# Patient Record
Sex: Female | Born: 1965
Health system: Southern US, Community
[De-identification: ages and names within clinical notes are randomized; demographics above are authoritative.]

## PROBLEM LIST (undated history)

## (undated) DIAGNOSIS — E785 Hyperlipidemia, unspecified: Secondary | ICD-10-CM

## (undated) DIAGNOSIS — R011 Cardiac murmur, unspecified: Secondary | ICD-10-CM

## (undated) DIAGNOSIS — F32A Depression, unspecified: Secondary | ICD-10-CM

## (undated) DIAGNOSIS — F419 Anxiety disorder, unspecified: Secondary | ICD-10-CM

## (undated) DIAGNOSIS — N200 Calculus of kidney: Secondary | ICD-10-CM

## (undated) DIAGNOSIS — M1990 Unspecified osteoarthritis, unspecified site: Secondary | ICD-10-CM

## (undated) DIAGNOSIS — K219 Gastro-esophageal reflux disease without esophagitis: Secondary | ICD-10-CM

## (undated) HISTORY — DX: Gastro-esophageal reflux disease without esophagitis: K21.9

## (undated) HISTORY — DX: Depression, unspecified: F32.A

## (undated) HISTORY — PX: COSMETIC SURGERY: SHX468

## (undated) HISTORY — PX: ABLATION: SHX5711

## (undated) HISTORY — PX: TUBAL LIGATION: SHX77

## (undated) HISTORY — DX: Unspecified osteoarthritis, unspecified site: M19.90

## (undated) HISTORY — DX: Cardiac murmur, unspecified: R01.1

## (undated) HISTORY — DX: Anxiety disorder, unspecified: F41.9

---

## 2000-09-30 ENCOUNTER — Encounter: Admission: RE | Admit: 2000-09-30 | Discharge: 2000-12-29 | Payer: Self-pay | Admitting: Obstetrics and Gynecology

## 2001-01-11 ENCOUNTER — Encounter: Payer: Self-pay | Admitting: Obstetrics and Gynecology

## 2001-01-11 ENCOUNTER — Inpatient Hospital Stay (HOSPITAL_COMMUNITY): Admission: AD | Admit: 2001-01-11 | Discharge: 2001-01-11 | Payer: Self-pay | Admitting: Obstetrics and Gynecology

## 2001-03-01 ENCOUNTER — Inpatient Hospital Stay (HOSPITAL_COMMUNITY): Admission: AD | Admit: 2001-03-01 | Discharge: 2001-03-03 | Payer: Self-pay | Admitting: Obstetrics and Gynecology

## 2001-05-29 ENCOUNTER — Other Ambulatory Visit: Admission: RE | Admit: 2001-05-29 | Discharge: 2001-05-29 | Payer: Self-pay | Admitting: Obstetrics and Gynecology

## 2002-10-11 ENCOUNTER — Other Ambulatory Visit: Admission: RE | Admit: 2002-10-11 | Discharge: 2002-10-11 | Payer: Self-pay | Admitting: Obstetrics and Gynecology

## 2003-07-16 ENCOUNTER — Encounter: Admission: RE | Admit: 2003-07-16 | Discharge: 2003-07-16 | Payer: Self-pay | Admitting: Obstetrics and Gynecology

## 2003-08-01 ENCOUNTER — Inpatient Hospital Stay (HOSPITAL_COMMUNITY): Admission: AD | Admit: 2003-08-01 | Discharge: 2003-08-01 | Payer: Self-pay | Admitting: Obstetrics and Gynecology

## 2003-08-06 ENCOUNTER — Inpatient Hospital Stay (HOSPITAL_COMMUNITY): Admission: AD | Admit: 2003-08-06 | Discharge: 2003-08-06 | Payer: Self-pay | Admitting: Obstetrics and Gynecology

## 2003-08-30 ENCOUNTER — Encounter (INDEPENDENT_AMBULATORY_CARE_PROVIDER_SITE_OTHER): Payer: Self-pay | Admitting: Specialist

## 2003-08-30 ENCOUNTER — Inpatient Hospital Stay (HOSPITAL_COMMUNITY): Admission: RE | Admit: 2003-08-30 | Discharge: 2003-09-02 | Payer: Self-pay | Admitting: Obstetrics and Gynecology

## 2003-10-14 ENCOUNTER — Other Ambulatory Visit: Admission: RE | Admit: 2003-10-14 | Discharge: 2003-10-14 | Payer: Self-pay | Admitting: Obstetrics and Gynecology

## 2004-05-14 ENCOUNTER — Encounter: Admission: RE | Admit: 2004-05-14 | Discharge: 2004-05-14 | Payer: Self-pay | Admitting: Family Medicine

## 2004-12-16 ENCOUNTER — Other Ambulatory Visit: Admission: RE | Admit: 2004-12-16 | Discharge: 2004-12-16 | Payer: Self-pay | Admitting: Obstetrics and Gynecology

## 2008-02-06 ENCOUNTER — Encounter: Admission: RE | Admit: 2008-02-06 | Discharge: 2008-02-06 | Payer: Self-pay | Admitting: Obstetrics and Gynecology

## 2008-09-01 ENCOUNTER — Emergency Department (HOSPITAL_COMMUNITY): Admission: EM | Admit: 2008-09-01 | Discharge: 2008-09-01 | Payer: Self-pay | Admitting: Emergency Medicine

## 2008-09-01 ENCOUNTER — Ambulatory Visit: Payer: Self-pay | Admitting: Internal Medicine

## 2009-04-21 ENCOUNTER — Encounter: Admission: RE | Admit: 2009-04-21 | Discharge: 2009-04-21 | Payer: Self-pay | Admitting: Family Medicine

## 2010-01-18 IMAGING — CT CT HEAD W/O CM
1 series · 16 of 30 positions shown, 20 images · non-contrast
Comparison: Head CT scan 05/14/2004.

CLINICAL DATA: Left sided facial numbness radiating to the left
arm.

CT HEAD WITHOUT CONTRAST
TECHNIQUE: Contiguous axial images were obtained from the base of
the skull through the vertex without contrast.

[Series 2: (id) head 4.8 h37s st · axial · 0.47mm/px · z∈[+1309,+1446]mm · 16 of 30 slices shown, 20 images]
[im 2/30  brain]
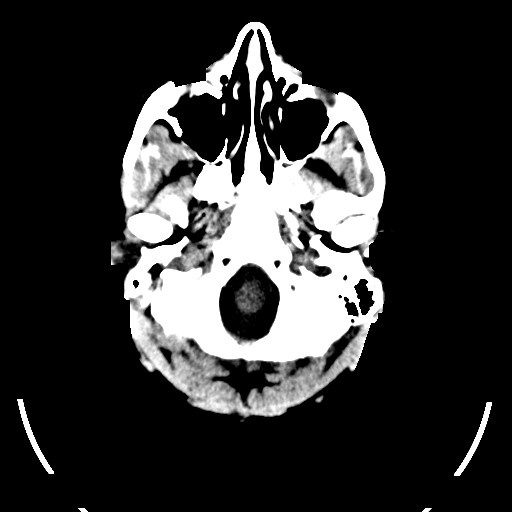
[im 2/30  bone]
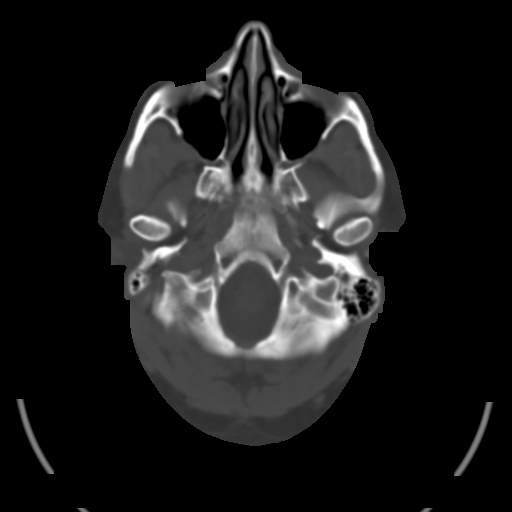
[im 4/30  brain]
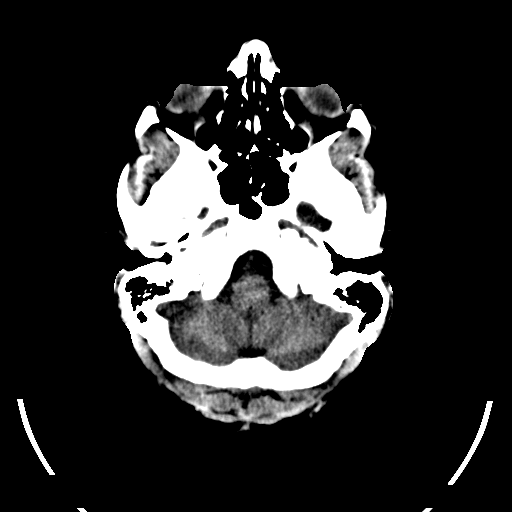
[im 6/30  brain]
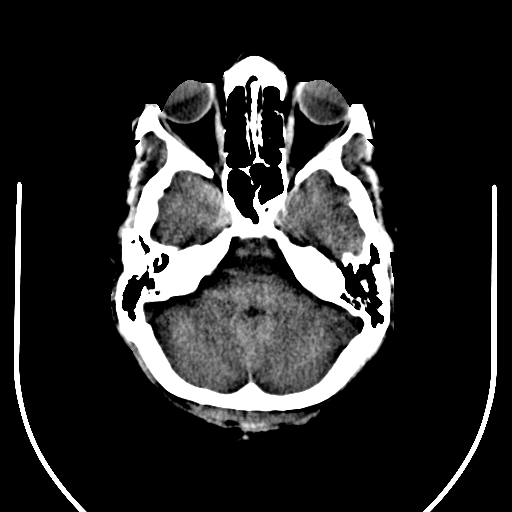
[im 8/30  brain]
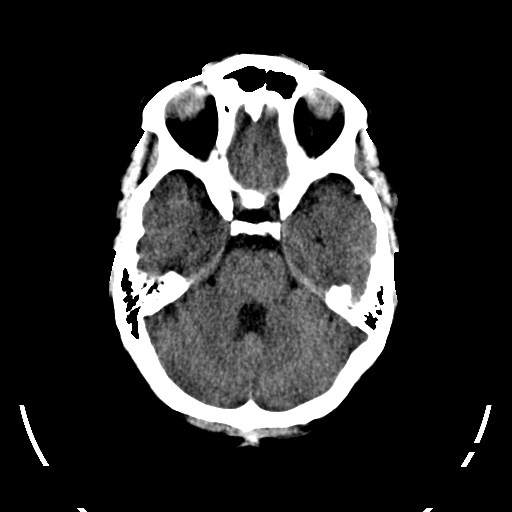
[im 9/30  brain]
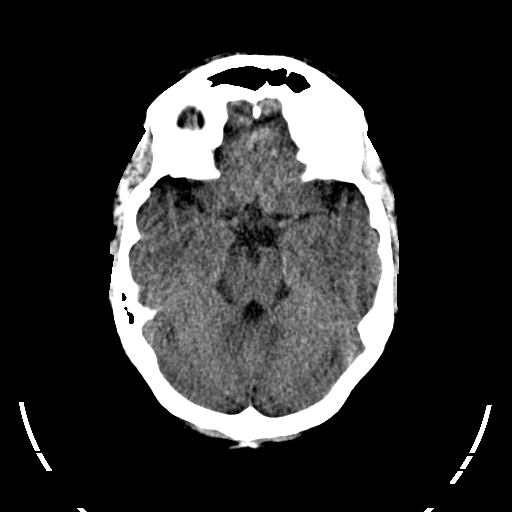
[im 9/30  bone]
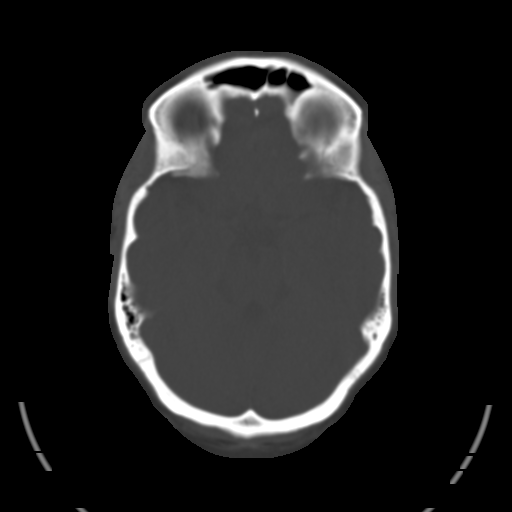
[im 11/30  brain]
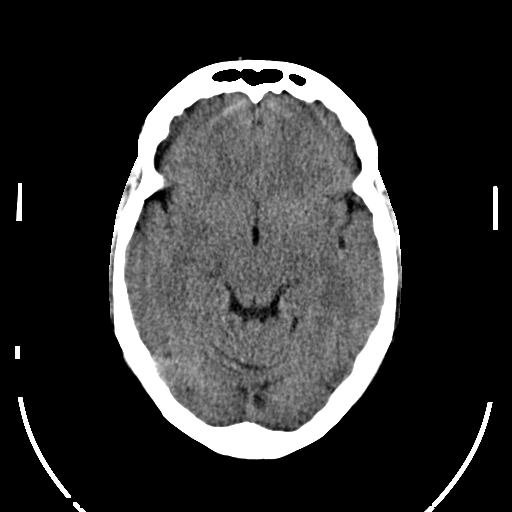
[im 13/30  brain]
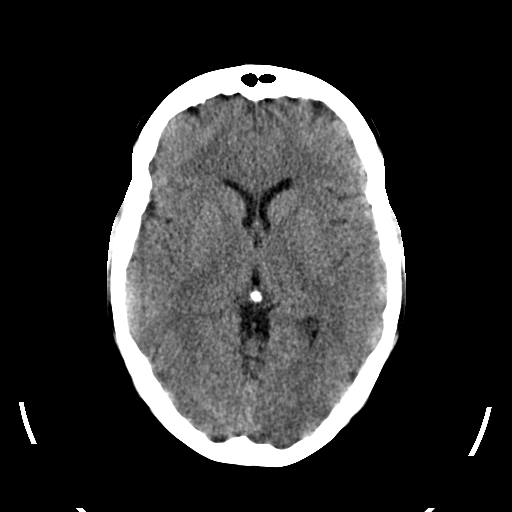
[im 15/30  brain]
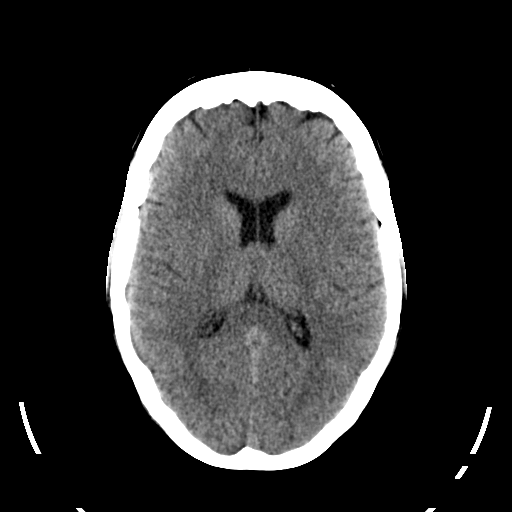
[im 16/30  brain]
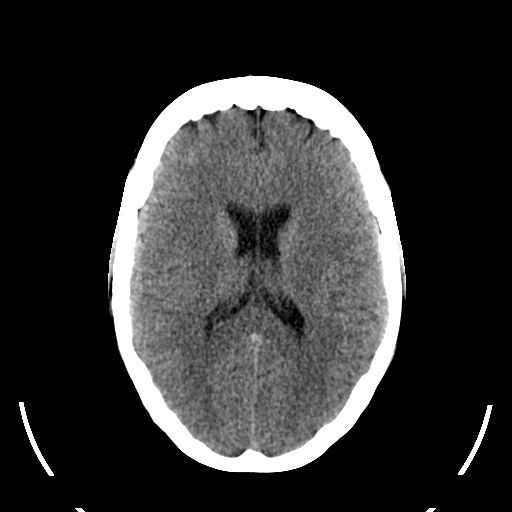
[im 16/30  bone]
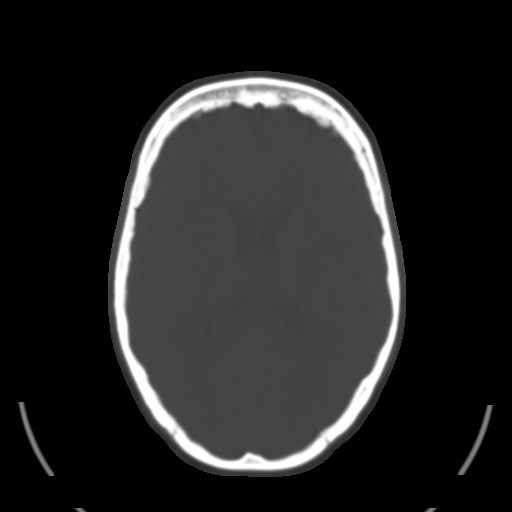
[im 18/30  brain]
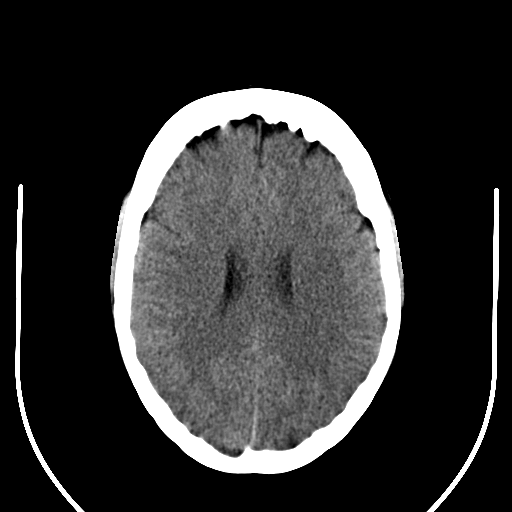
[im 20/30  brain]
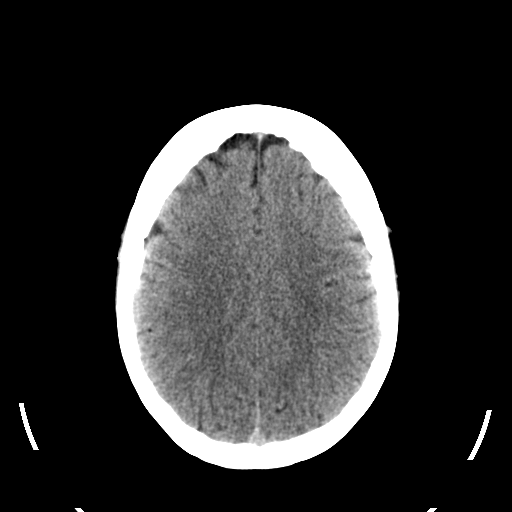
[im 22/30  brain]
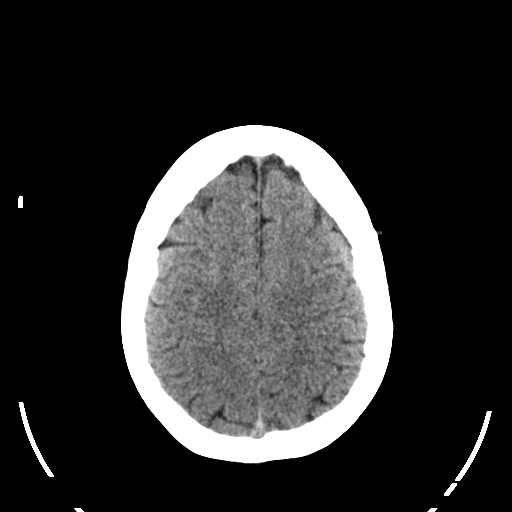
[im 23/30  brain]
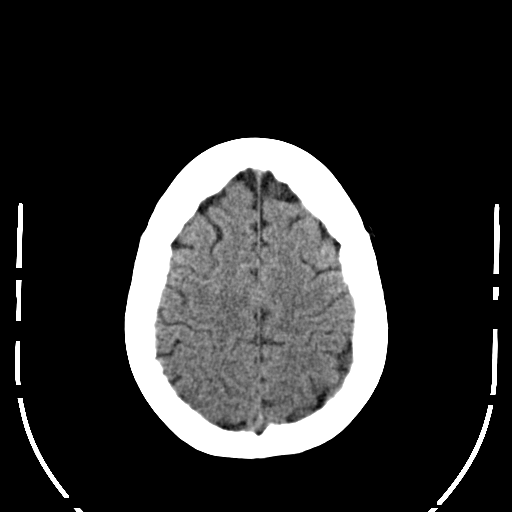
[im 23/30  bone]
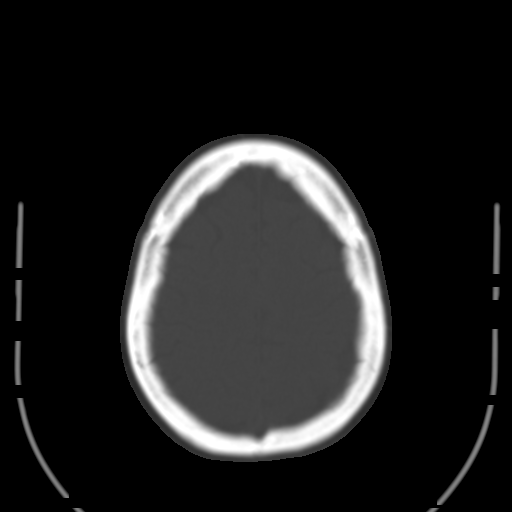
[im 25/30  brain]
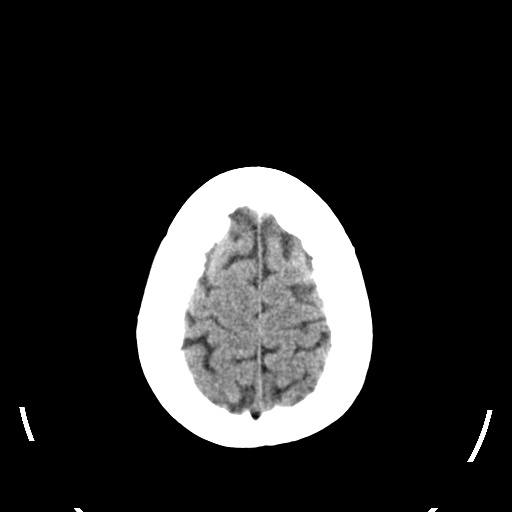
[im 27/30  brain]
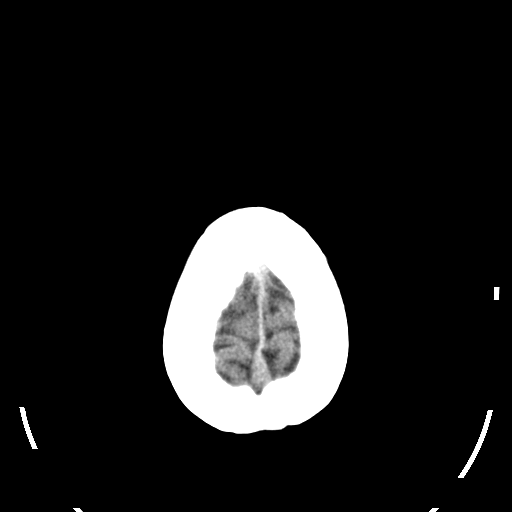
[im 29/30  brain]
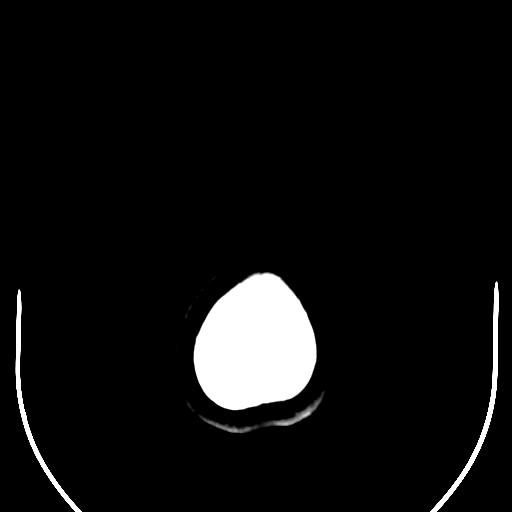

[16 of 30 positions shown; findings below may reference images not displayed]

FINDINGS: The brain appears normal without evidence of hemorrhage,
infarct, mass, mass effect, midline shift or abnormal extra-axial
fluid collection.  No hydrocephalus.  Imaged paranasal sinuses and
mastoid air cells are clear.
IMPRESSION: Negative exam.

REF:[id number] DICTATED: 09/01/2008 [DATE]

## 2010-06-30 ENCOUNTER — Encounter
Admission: RE | Admit: 2010-06-30 | Discharge: 2010-06-30 | Payer: Self-pay | Source: Home / Self Care | Attending: Family Medicine | Admitting: Family Medicine

## 2010-08-21 ENCOUNTER — Other Ambulatory Visit: Payer: Self-pay | Admitting: Family Medicine

## 2010-08-21 DIAGNOSIS — R928 Other abnormal and inconclusive findings on diagnostic imaging of breast: Secondary | ICD-10-CM

## 2010-08-25 ENCOUNTER — Ambulatory Visit
Admission: RE | Admit: 2010-08-25 | Discharge: 2010-08-25 | Disposition: A | Discharge status: Home / Self Care | Payer: Commercial Indemnity | Source: Ambulatory Visit | Attending: Family Medicine | Admitting: Family Medicine

## 2010-08-25 DIAGNOSIS — R928 Other abnormal and inconclusive findings on diagnostic imaging of breast: Secondary | ICD-10-CM

## 2010-11-03 LAB — CBC
HCT: 39.6 % (ref 36.0–46.0)
Hemoglobin: 13.6 g/dL (ref 12.0–15.0)
RBC: 4.23 MIL/uL (ref 3.87–5.11)

## 2010-11-03 LAB — CK TOTAL AND CKMB (NOT AT ARMC)
CK, MB: 1.4 ng/mL (ref 0.3–4.0)
CK, MB: 1.4 ng/mL (ref 0.3–4.0)
Relative Index: 1.4 (ref 0.0–2.5)

## 2010-11-03 LAB — BASIC METABOLIC PANEL
CO2: 25 mEq/L (ref 19–32)
Calcium: 8.9 mg/dL (ref 8.4–10.5)
GFR calc Af Amer: >60 mL/min (ref >60)
GFR calc non Af Amer: >60 mL/min (ref >60)
Potassium: 3.7 mEq/L (ref 3.5–5.1)
Sodium: 139 mEq/L (ref 135–145)

## 2010-11-03 LAB — TROPONIN I: Troponin I: <0.01 ng/mL (ref 0.00–0.06)

## 2010-11-03 LAB — POCT CARDIAC MARKERS
CKMB, poc: <1 ng/mL — ABNORMAL LOW (ref 1.0–8.0)
Myoglobin, poc: 40.4 ng/mL (ref 12–200)

## 2010-11-03 LAB — GLUCOSE, CAPILLARY: Glucose-Capillary: 110 mg/dL — ABNORMAL HIGH (ref 70–99)

## 2010-11-03 LAB — DIFFERENTIAL
Basophils Absolute: 0 10*3/uL (ref 0.0–0.1)
Lymphocytes Relative: 17 % (ref 12–46)
Neutro Abs: 5.6 10*3/uL (ref 1.7–7.7)
Neutrophils Relative %: 79 % — ABNORMAL HIGH (ref 43–77)

## 2010-12-01 NOTE — Consult Note (Signed)
NAMEJAMISON, YUHASZ                  ACCOUNT NO.:  [id number]   MEDICAL RECORD NO.:  [id number]          PATIENT TYPE:  INP   LOCATION:  1825                         FACILITY:  [facility]   PHYSICIAN:  Wendi Snipes, MD DATE OF BIRTH:  06-May-1966   DATE OF CONSULTATION:  09/01/2008  DATE OF DISCHARGE:                                 CONSULTATION   PRIMARY CARE DOCTOR:  Dr. Marny Lowenstein.   CHIEF COMPLAINT:  Shortness of breath, left face and arm tingling.   HISTORY OF PRESENT ILLNESS:  This is a 45 year old with hypertension,  diabetes, hyperlipidemia and history of panic attacks here with  complaints of shortness of breath, chest tightness, and left face an arm  paresthesias.  She states that she was in [employer] shopping when she  experienced the acute onset of these symptoms.  She was driving home  with her mother and her panicky feeling continued.  She denies outright  chest pain, though feels like she was mainly short of breath.  She  subsequently reported to an Urgent Care Center and after a brief workup  and a review of her current cardiac risk factors, she was referred to  the [facility] ED for further evaluation and management of her acute  symptoms.  With further questioning, she states that her symptoms feel  exactly the same like her previous panic attack.  Her last panic attack  happened within the last 5 years.  Otherwise she denies other cardiac  symptoms at baseline including dyspnea on exertion, shortness of breath,  chest pain, paroxysmal nocturnal dyspnea, orthopnea, increased lower  extremity edema, syncope, presyncope or palpitations.   PAST MEDICAL HISTORY:  1. Diabetes.  2. Hypertension.  3. History of panic attacks.  4. Depression.  5. Hyperlipidemia.   ALLERGIES:  No known drug allergies.   MEDICATIONS ON ADMISSION:  1. Metformin.  2. Lipitor 80 mg daily.  3. Aspirin 81 mg daily.  4. Glipizide.  5. Atenolol.  6. Wellbutrin.  7. Amitriptyline.   SOCIAL HISTORY:  She lives in [place].  She works at [employer].  She has a 15 pack-year history.  However, quit approximately 3  years ago.   FAMILY HISTORY:  Her uncle died of an MI at 89.  Otherwise no other  premature coronary artery disease.   REVIEW OF SYSTEMS:  All 14 systems were reviewed and were negative  except as mentioned in detail in the HPI.   CODE STATUS:  Full.   PHYSICAL EXAM:  VITAL SIGNS:  Her blood pressure is 100/57, her pulse is  71 and regular.  She is breathing 16 times a minute, unlabored.  She is  satting 91% on 2 liters nasal cannula.  She has on dark fingernail  polish and is not to tracking well.  GENERAL:  She is a 45 year old white female appearing stated age in no  acute distress.  She is somewhat obese.  HEENT:  Moist mucous membranes.  Pupils equal, round, react to light  accommodation.  Anicteric sclera.  NECK:  No jugular venous distention.  No  thyromegaly.  CARDIOVASCULAR:  Regular rate and rhythm.  No murmurs, rubs or gallops.  LUNGS:  Clear to auscultation bilaterally.  ABDOMEN:  Nontender, nondistended.  Positive bowel sounds.  No masses.  EXTREMITIES:  No clubbing, cyanosis, edema, 2+ pulses throughout.  NEUROLOGIC:  Alert and oriented x3.  Cranial nerves II-XII grossly  intact.  No focal neurologic deficits.  SKIN:  Warm, dry intact.  No rashes.  PSYCH:  Mood and affect are appropriate.   RADIOLOGY:  Noncontrast CT of her head was negative for any acute  process.  Chest x-ray showed no cardiac or pulmonary process.   EKG:  Showed normal sinus rhythm with a rate of 67 with axis of -22.  However, no ST or T-wave abnormalities suggestive of ischemia and normal  EKG.   LABORATORY REVIEW:  Her white count 7.1, hematocrit is 39.6, platelets  226,000.  Potassium is 3.7.  Her BUN 8, her creatinine is 0.7.  Her  troponin and CK-MB are undetectable at this point.   ASSESSMENT:  This is a 45 year old white female with cardiac  risk  factors here with atypical chest pain and shortness of breath.  Chest  pain.  This is very atypical for acute coronary artery syndrome and  there are currently no objective signs of ischemia.  Her symptoms are  currently resolved and she has no concerning symptoms at baseline.  Please continue risk factor modification as you are and I have advised  the patient to closely follow up with her PCP concerning possible  exacerbation of her anxiety and depression.  Please consider further  noninvasive risk ratification if her symptoms continue and are more  associated with exertion.      Wendi Snipes, MD  Electronically Signed     BHH/MEDQ  D:  09/01/2008  T:  09/01/2008  Job:  [id number]

## 2010-12-01 NOTE — H&P (Signed)
[name], [name]                  ACCOUNT NO.:  [id number]   MEDICAL RECORD NO.:  [id number]          PATIENT TYPE:  INP   LOCATION:  1825                         FACILITY:  [facility]   PHYSICIAN:  Kela Millin, M.D.DATE OF BIRTH:  09-Jul-1966   DATE OF ADMISSION:  09/01/2008  DATE OF DISCHARGE:                              HISTORY & PHYSICAL   PRIMARY CARE PHYSICIAN:  Dr. Dorothe Pea.   CHIEF COMPLAINT:  Chest pain and left arm numbness.   HISTORY OF PRESENT ILLNESS:  The patient is an obese 45 year old white  female with past medical history significant for diabetes and  hypertension who presents with the above complaints.  She states that  she was out shopping today when she developed chest pain, left  precordial, and she states that she would not describe it as pressure  but unable to further characterize and states that it lasted just about  a second and resolved spontaneously.  She had some associated shortness  of breath and numbness with tingling down her left arm and initially  also had some numbness on the left side of her face but that resolved.  She reports that she also has a history of panic attacks and so she  began to panic after the onset of her symptoms.  She initially went to  the [facility] and was asked to come to the ED.  She denies  cough, fevers, hematemesis, melena, hematochezia, and no focal weakness.   She was seen in the ED and an EKG showed normal sinus rhythm at a rate  of 67 with T-wave inversions in III and aVF.  Her point of care markers  were negative.  She also had a CT scan of her head done which was  negative.  She is admitted for further evaluation and management.   PAST MEDICAL HISTORY:  As above.   MEDICATIONS:  1. Metformin 1000 mg p.o. b.i.d.  2. (?)Glimepiride 1 mg daily.  3. Atenolol - does not know dose.  4. Wellbutrin 300 mg daily.  5. Amitriptyline 25 mg nightly.   ALLERGIES:  NKDA.   SOCIAL HISTORY:  She quit tobacco 3  years ago after smoking for about 15-  20 years.  She denies alcohol.   FAMILY HISTORY:  Negative for heart disease.   REVIEW OF SYSTEMS:  As per HPI, other review of systems negative.   PHYSICAL EXAM:  GENERAL:  The patient is a pleasant obese middle-aged  white female, appears anxious, and it in no respiratory distress.  VITAL SIGNS:  Her temperature is 97.8, blood pressure is 104/48.  Pulse  is 64, respiratory rate is 16, O2 sat is 99%.  HEENT:  PERRL, EOMI, sclerae anicteric, moist mucous membranes.  NECK:  Supple, no adenopathy, no thyromegaly and no JVD.  LUNGS:  Clear to auscultation bilaterally.  No crackles or wheezes.  CARDIOVASCULAR:  Regular rate and rhythm.  Normal S1, S2.  No S3  appreciated.  ABDOMEN:  Obese, bowel sounds present, nontender, nondistended.  No  organomegaly and no masses palpable.  EXTREMITIES:  No cyanosis and  no edema.  NEURO:  She is alert and oriented x3.  Cranial nerves 2-12 are grossly  intact.  Nonfocal exam.   LABORATORY DATA:  As per HPI.  Also her white cell count is 7.1 with a  hemoglobin of 13.6, hematocrit of 39.6, platelet count of 226,000,  neutrophil count of 79%.  Sodium is 139, potassium 3.7, chloride 107,  CO2 of 25, glucose is 108, BUN is 8, creatinine is 0.67, calcium of 8.9.  Chest x-ray - no acute infiltrates.   ASSESSMENT AND PLAN:  1. Chest pain - as discussed above, also T-wave inversions in III and      aVF and she has a history of hypertension, diabetes and is obese.      Will obtain serial cardiac enzymes and a D-dimer.  Place on aspirin      and p.r.n. nitroglycerin.  I have consulted cardiology for further      risk stratification.  Likely anxiety component present, will add      Xanax and continue outpatient medications.  2. Diabetes - Accu-Cheks, sliding scale insulin coverage, continue      Amaryl.  Will hold metformin for now in anticipation of further      cardiac evaluation.  3. Hypertension - will continue  atenolol, follow and obtain outpatient      dose.  4. History of depression/anxiety - as above Xanax and continue      outpatient medications.      Kela Millin, M.D.  Electronically Signed     ACV/MEDQ  D:  09/01/2008  T:  09/01/2008  Job:  [id number]   cc:   Jethro Bastos, M.D.

## 2010-12-04 NOTE — Op Note (Signed)
[facility]  Patient:    Laura [name], Laura [name] Visit Number: [id number] MRN: [id number]          Service Type: OBS Location: 910A 9146 01 Attending Physician:  Marcelle Overlie Dictated by:   Marcelle Overlie, M.D. Proc. Date: 03/01/01 Admit Date:  03/01/2001 Discharge Date: 03/03/2001                             Operative Report  PREOPERATIVE DIAGNOSES:       1. Intrauterine pregnancy at 35-6/7 weeks.                               2. Preterm premature rupture of membranes.                               3. Breech.                               4. Insulin-requiring gestational diabetic.  POSTOPERATIVE DIAGNOSES:            1. Intrauterine pregnancy at 35-6/7 weeks.                               2. Preterm premature rupture of membranes.                               3. Breech.                               4. Insulin-requiring gestational diabetic.  PROCEDURE:                    Primary low transverse cesarean section.  SURGEON:                      Marcelle Overlie, M.D.  ANESTHESIA:                   Spinal.  ESTIMATED BLOOD LOSS:         500 cc.  FINDINGS:                     Female infant in frank breech presentation with Apgars of 8 at one minute and 9 at five minutes weighing 6 lb 12 oz.  DESCRIPTION OF PROCEDURE:     The patient was taken to the operating room after informed consent was obtained.  She then had a spinal placed without incident.  The abdomen was prepped and draped in the usual sterile fashion.  A Foley catheter was placed in the bladder.  Using a scalpel, a low transverse incision was made and carried down to the fascia.  Using good hemostasis, the fascia was scored in the midline and extended laterally.  A Pfannenstiel incision incision was then created.  The peritoneum was entered sharply.  The peritoneal incision was then extended.  A bladder flap was then created sharply and digitally and a bladder blade was then inserted.   The lower uterine segment was then identified.  A low transverse incision was made in the uterus and extended laterally.  The amniotic  fluid was noted to be clear upon entry into the uterine cavity.  The baby was in the breech presentation and was delivered without difficulty.  It was a female infant with Apgars of 8 at one minute and 9 at five minutes and weighed 6 lb 12 oz.  The cord was clamped and cut.  The baby was handed to the waiting pediatricians.  The placenta was manually removed and noted to be intact.  The uterus was cleared of all clot and debris.  The uterine incision was closed in a single layer using 0 chromic in continuous running lock stitch.  It was inspected and noted to be hemostatic.  The peritoneum was closed in a single layer using 0 Vicryl in a continuous running stitch.  The rectus muscles were reapproximated using the same 0 Vicryl.  The fascia was closed using 0 Vicryl in a continuous running stitch starting at each corner and meeting in the midline.  After inspection of the subcutaneous layer, the skin was closed with staples.  All sponge, lap and instrument counts were correct x 2.  The patient tolerated the procedure well and went to the recovery room in stable condition. Dictated by:   Marcelle Overlie, M.D. Attending Physician:  Marcelle Overlie DD:  03/17/01 TD:  03/17/01 Job: [id number]

## 2010-12-04 NOTE — Discharge Summary (Signed)
Laura [name], Laura [name]                            ACCOUNT NO.:  [id number]   MEDICAL RECORD NO.:  [id number]                   PATIENT TYPE:  INP   LOCATION:  9130                                 FACILITY:  [facility]   PHYSICIAN:  Duke Salvia. Marcelle Overlie, M.D.            DATE OF BIRTH:  07/21/1965   DATE OF ADMISSION:  08/30/2003  DATE OF DISCHARGE:  09/02/2003                                 DISCHARGE SUMMARY   ADMITTING DIAGNOSES:  1. Intrauterine pregnancy at 37 weeks estimated gestational age.  2. Previous cesarean section; desires repeat.  3. Gestational diabetes.  4. Mature amniocentesis.  5. Multiparity, desires sterilization.   DISCHARGE DIAGNOSES:  1. Low transverse cesarean section.  2. Viable female infant.  3. Permanent sterilization.   PROCEDURE:  1. Repeat low transverse cesarean section.  2. Modified Pomeroy bilateral tubal ligation.   REASON FOR ADMISSION:  Please see written H&P.   HOSPITAL COURSE:  The patient was [name] 45 year old, gravida 2, para 1, who was  admitted to [facility] for [name] scheduled cesarean section.  The patient had had gestational diabetes during her pregnancy, which had  required insulin administration for control of her glucose level.  Amniocentesis had been performed prior to admission on the previous day,  which revealed an LS ratio of 4.7:1 with PG.  The patient had also requested  bilateral tubal ligation.  The patient was admitted on the following morning  for the scheduled cesarean section.  She was taken to the operating room  where spinal anesthesia was administered without difficulty.  [name] low  transverse incision was made with delivery of [name] viable female infant weighing  6 pounds, 7 ounces, with Apgars of 8 at 1 minute and 9 at 5 minutes.  The  patient tolerated the procedure well, and was taken to the recovery room in  stable condition.   On postoperative day #1, the patient was without complaints, vital signs  were stable, she was  afebrile, abdomen was soft, with good return of bowel  function.  Abdominal dressing was noted to be clean, dry, and intact.  Laboratories revealed [name] hemoglobin of 12.4.  WBC count of 11.5.  On  postoperative day #2, the patient was without complaint, she was tolerating  [name] regular diet, vital signs were stable.  The abdominal dressing had been  removed, which revealed an incision that was clean, dry, and intact.  The  patient was ambulating well.  On postoperative day #3, the patient was doing  well, vital signs were stable, she was afebrile, and the incision was clean,  dry, and intact.  The staples were removed, and the patient was discharged  home.   CONDITION ON DISCHARGE:  Good.   DIET:  Regular as tolerated.   ACTIVITY:  No heavy lifting.  No driving x2 weeks.  No vaginal entry.   FOLLOW UP:  The patient is to follow up  in the office in 1 week for an  incision check.  She is to call for temperature greater than 100.3,  persistent nausea or vomiting, heavy vaginal bleeding, any redness or  drainage at the incision site.   DISCHARGE MEDICATIONS:  1. Percocet 5/532, #30, one p.o. q.4-6h. p.r.n. pain.  2. Motrin 600 mg q.8h. p.r.n.  3. Prenatal vitamins one p.o. daily.  4. Colace one p.o. daily p.r.n.     Julio Sicks, N.P.                        Richard M. Marcelle Overlie, M.D.    CC/MEDQ  D:  09/22/2003  T:  09/22/2003  Job:  [id number]

## 2010-12-04 NOTE — Discharge Summary (Signed)
[facility]  Patient:    Laura [name], Laura [name] Visit Number: [id number] MRN: [id number]          Service Type: OBS Location: 910A 9146 01 Attending Physician:  Marcelle Overlie Dictated by:   Miguel Aschoff, M.D. Admit Date:  03/01/2001 Discharge Date: 03/03/2001                             Discharge Summary  ADMISSION DIAGNOSES:          1. Intrauterine pregnancy at 35-6/7 weeks.                               2. Spontaneous rupture of membranes.                               3. Breech presentation.                               4. Insulin-requiring gestational diabetes.  OPERATIONS AND PROCEDURES:    1. Primary low flap transverse cesarean section.                               2. Spinal anesthesia.  BRIEF HISTORY:                The patient is a 45 year old white female, gravida 1, para 0, who developed gestational diabetes requiring insulin therapy. The patient presented to the [facility] on March 01, 2001 with report of spontaneous rupture of membranes. On examination, she was found to have ruptured membranes, but, in addition, was noted to be presenting with a breech presentation. In view of the malpresentation, the patient was taken to the operating room where under spinal anesthesia, a primary low flap transverse cesarean section was carried out by Dr. Vincente Poli and this resulted in the delivery of a viable female infant, Apgars 9 at one minute and 9 at five minutes, as a frank breech. The baby weighed 6 pounds 12 ounces. The patients postoperative course was essentially uncomplicated. She tolerated increasing in ambulation and diet well. She did not require any further insulin. She was able to be discharged home on August 16 in satisfactory condition.  DISCHARGE FOLLOWUP:           She was told to return to the office on August 19 for her staples to be removed.  DISCHARGE MEDICATIONS:        Tylox one every three hours as needed  for pain.  DISCHARGE INSTRUCTIONS:       She was instructed to do no heavy lifting, place nothing in the vagina, and to call if there were any problems such as fever, pain or heavy bleeding. Dictated by:   Miguel Aschoff, M.D. Attending Physician:  Marcelle Overlie DD:  04/13/01 TD:  04/13/01 Job: [id number] XL/[id number]

## 2010-12-04 NOTE — Op Note (Signed)
NAME:  Laura Laura [name], Laura Laura [name]                            ACCOUNT NO.:  [id number]   MEDICAL RECORD NO.:  [id number]                   PATIENT TYPE:  INP   LOCATION:  9130                                 FACILITY:  [facility]   PHYSICIAN:  Michelle L. Vincente Poli, M.D.            DATE OF BIRTH:  [birth year]   DATE OF PROCEDURE:  08/30/2003  DATE OF DISCHARGE:                                 OPERATIVE REPORT   PREOPERATIVE DIAGNOSES:  1. Intrauterine pregnancy at 37 weeks.  2. Previous cesarean section.  3. Gestational diabetes.  4. Mature amniocentesis.  5. Desires permanent sterilization.   POSTOPERATIVE DIAGNOSES:  1. Intrauterine pregnancy at 37 weeks.  2. Previous cesarean section.  3. Gestational diabetes.  4. Mature amniocentesis.  5. Desires permanent sterilization.   PROCEDURES:  1. Repeat low transverse cesarean section.  2. Modified Pomeroy bilateral tubal ligation.   SURGEON:  Michelle L. Vincente Poli, M.D.   ANESTHESIA:  Spinal.   ESTIMATED BLOOD LOSS:  500 mL.   FINDINGS:  Laura [name] female infant, Apgars 8 at one minute and 9 at five minutes,  weighing 6 pounds 7 ounces.   DESCRIPTION OF PROCEDURE:  The patient was taken to the operating room,  spinal was placed without incident.  She was then prepped and draped in the  usual sterile fashion, Laura [name] Foley catheter was inserted in the bladder.  Using  Laura [name] scalpel, low transverse incision was made and carried down to the fascia  and the fascia was scored in the midline and extended laterally.  Laura [name]  Pfannenstiel incision was then developed.  The rectus muscles were separated  in the midline and the peritoneum was entered bluntly.  The peritoneal  incision was then stretched.  The bladder blade was inserted and the lower  uterine segment was identified.  The bladder flap was then developed sharply  and then digitally and the bladder blade was reinserted.  Laura [name] low transverse  incision was made in the uterus.  The uterus was then entered with Laura [name]  hemostat and  the incision was extended laterally.  The baby was in the  cephalic presentation and was delivered easily with one pull of the vacuum.  It was Laura [name] female infant with Apgars 8 at one minute and 9 at five minutes and  weighing 6 pounds 7 ounces.  The baby was handed to the awaiting  pediatricians after the cord was clamped and cut.  The cord blood was  obtained, and the placenta was manually removed and noted to be normal and  intact.  The uterus was exteriorized.  It was cleared of all clots and  debris.  The uterine incision was closed in one layer using 0 chromic in Laura [name]  continuous running stitch.  We then proceeded with Laura [name] bilateral tubal  ligation in the following fashion:  The midportion of each tube was grasped  using Laura [name] __________ clamp.  Each knuckle was tied  off using plain gut suture  x2 and the knuckle was excised using Metzenbaum scissors and was hemostatic.  The uterus was returned to the abdomen.  Irrigation was performed.  All  incisions were inspected and hemostasis was noted.  The peritoneum was  closed using an 0 Vicryl in Laura [name] continuous running stitch and the rectus  muscles were reapproximated using the same 0 Vicryl.  The fascia was closed  using continuous running stitch starting at each corner and meeting in the  midline.  After irrigation of the subcutaneous layer the skin was closed  with staples.  All sponge, lap, and instrument counts were correct x2.  The  patient tolerated the procedure well and went to the recovery room in stable  condition.                                               Michelle L. Vincente Poli, M.D.    Florestine Avers  D:  08/30/2003  T:  08/30/2003  Job:  [id number]

## 2011-03-18 ENCOUNTER — Other Ambulatory Visit: Payer: Self-pay | Admitting: Family Medicine

## 2011-03-18 ENCOUNTER — Ambulatory Visit
Admission: RE | Admit: 2011-03-18 | Discharge: 2011-03-18 | Disposition: A | Discharge status: Home / Self Care | Payer: Commercial Indemnity | Source: Ambulatory Visit | Attending: Family Medicine | Admitting: Family Medicine

## 2011-03-18 DIAGNOSIS — R0789 Other chest pain: Secondary | ICD-10-CM

## 2011-03-24 ENCOUNTER — Ambulatory Visit: Payer: Commercial Indemnity | Admitting: Family Medicine

## 2011-08-12 ENCOUNTER — Other Ambulatory Visit: Payer: Self-pay | Admitting: Family Medicine

## 2011-08-12 DIAGNOSIS — Z1231 Encounter for screening mammogram for malignant neoplasm of breast: Secondary | ICD-10-CM

## 2011-08-31 ENCOUNTER — Ambulatory Visit: Payer: Commercial Indemnity

## 2011-09-04 ENCOUNTER — Emergency Department (INDEPENDENT_AMBULATORY_CARE_PROVIDER_SITE_OTHER): Payer: Commercial Indemnity

## 2011-09-04 ENCOUNTER — Emergency Department (HOSPITAL_BASED_OUTPATIENT_CLINIC_OR_DEPARTMENT_OTHER)
Admission: EM | Admit: 2011-09-04 | Discharge: 2011-09-04 | Disposition: A | Discharge status: Home / Self Care | Payer: Commercial Indemnity | Attending: Emergency Medicine | Admitting: Emergency Medicine

## 2011-09-04 ENCOUNTER — Encounter (HOSPITAL_BASED_OUTPATIENT_CLINIC_OR_DEPARTMENT_OTHER): Payer: Self-pay | Admitting: *Deleted

## 2011-09-04 DIAGNOSIS — R109 Unspecified abdominal pain: Secondary | ICD-10-CM | POA: Insufficient documentation

## 2011-09-04 DIAGNOSIS — N390 Urinary tract infection, site not specified: Secondary | ICD-10-CM | POA: Insufficient documentation

## 2011-09-04 DIAGNOSIS — R1031 Right lower quadrant pain: Secondary | ICD-10-CM

## 2011-09-04 HISTORY — DX: Calculus of kidney: N20.0

## 2011-09-04 LAB — URINALYSIS, ROUTINE W REFLEX MICROSCOPIC
Glucose, UA: 500 mg/dL — AB
Leukocytes, UA: NEGATIVE
Protein, ur: NEGATIVE mg/dL
Specific Gravity, Urine: 1.028 (ref 1.005–1.030)
pH: 5.5 (ref 5.0–8.0)

## 2011-09-04 LAB — URINE MICROSCOPIC-ADD ON

## 2011-09-04 LAB — PREGNANCY, URINE: Preg Test, Ur: NEGATIVE

## 2011-09-04 MED ORDER — CIPROFLOXACIN HCL 500 MG PO TABS: 500.0000 mg | ORAL_TABLET | Freq: Two times a day (BID) | ORAL | Status: DC

## 2011-09-04 MED ORDER — DEXTROSE 5 % IV SOLN
1.0000 g | INTRAVENOUS | Status: DC
  Administered 2011-09-04: 1 g via INTRAVENOUS
  Filled 2011-09-04: qty 10

## 2011-09-04 MED ORDER — CIPROFLOXACIN HCL 500 MG PO TABS: 500.0000 mg | ORAL_TABLET | Freq: Two times a day (BID) | ORAL | Status: AC

## 2011-09-04 MED ORDER — HYDROCODONE-ACETAMINOPHEN 5-325 MG PO TABS: 2.0000 | ORAL_TABLET | ORAL | Status: AC | PRN

## 2011-09-04 MED ORDER — ONDANSETRON HCL 4 MG/2ML IJ SOLN
4.0000 mg | Freq: Once | INTRAMUSCULAR | Status: AC
  Administered 2011-09-04: 4 mg via INTRAVENOUS
  Filled 2011-09-04: qty 2

## 2011-09-04 MED ORDER — HYDROMORPHONE HCL PF 1 MG/ML IJ SOLN
1.0000 mg | Freq: Once | INTRAMUSCULAR | Status: AC
  Administered 2011-09-04: 1 mg via INTRAVENOUS
  Filled 2011-09-04: qty 1

## 2011-09-04 NOTE — ED Provider Notes (Signed)
History     CSN: [id number]  Arrival date & time 09/04/11  1350   First MD Initiated Contact with Patient 09/04/11 1518      Chief Complaint  Patient presents with  . Flank Pain    (Consider location/radiation/quality/duration/timing/severity/associated sxs/prior treatment) Patient is a 46 y.o. female presenting with abdominal pain. The history is provided by the patient. No language interpreter was used.  Abdominal Pain The primary symptoms of the illness include abdominal pain. The current episode started less than 1 hour ago. The onset of the illness was gradual. The problem has been gradually worsening.  Associated with: hx of kidney stones. The patient states that she believes she is currently not pregnant. The patient has not had a change in bowel habit. Additional symptoms associated with the illness include back pain. Significant associated medical issues include diabetes. Significant associated medical issues do not include inflammatory bowel disease.  Pt complains of pain in the right side of her back. Pt reports she has been evaluated in the past for kidney stone    Past Medical History  Diagnosis Date  . Kidney stones   . Diabetes mellitus     Past Surgical History  Procedure Date  . Cesarean section     History reviewed. No pertinent family history.  History  Substance Use Topics  . Smoking status: Never Smoker   . Smokeless tobacco: Not on file  . Alcohol Use: No    OB History    Grav Para Term Preterm Abortions TAB SAB Ect Mult Living                  Review of Systems  Gastrointestinal: Positive for abdominal pain.  Musculoskeletal: Positive for back pain.  All other systems reviewed and are negative.    Allergies  Review of patient's allergies indicates no known allergies.  Home Medications   Current Outpatient Rx  Name Route Sig Dispense Refill  . ATENOLOL 50 MG PO TABS Oral Take 50 mg by mouth daily.    . ATORVASTATIN CALCIUM 80 MG PO  TABS Oral Take 80 mg by mouth daily.    Marland Kitchen GLIMEPIRIDE 4 MG PO TABS Oral Take 4 mg by mouth daily before breakfast.    . LEVEMIR [place] Subcutaneous Inject into the skin.    Marland Kitchen METFORMIN HCL 1000 MG PO TABS Oral Take 1,000 mg by mouth 2 (two) times daily with a meal.      BP 130/71  Pulse 62  Temp(Src) 97.6 F (36.4 C) (Axillary)  Resp 20  Ht 5' 4.5" (1.638 m)  Wt 220 lb (99.791 kg)  BMI 37.18 kg/m2  SpO2 98%  LMP 08/24/2011  Physical Exam  Constitutional: She is oriented to person, place, and time. She appears well-developed and well-nourished.  HENT:  Head: Normocephalic and atraumatic.  Eyes: Pupils are equal, round, and reactive to light.  Neck: Normal range of motion.  Cardiovascular: Normal rate.   Pulmonary/Chest: Effort normal.  Abdominal: Soft. Bowel sounds are normal. There is tenderness.  Musculoskeletal: Normal range of motion.  Neurological: She is alert and oriented to person, place, and time.  Skin: Skin is warm.  Psychiatric: She has a normal mood and affect.    ED Course  Procedures (including critical care time)   Labs Reviewed  PREGNANCY, URINE  URINALYSIS, ROUTINE W REFLEX MICROSCOPIC   No results found.   No diagnosis found.    MDM  Pt given Iv fluids,  Pt's urine shows uti. No stone.  I advised follow up with MD on Monday       Langston Masker, [place] 09/04/11 1821

## 2011-09-04 NOTE — ED Notes (Signed)
Patient transported to CT 

## 2011-09-04 NOTE — ED Notes (Signed)
Pt is requesting pain medication to help with the pain, Clydie Braun, [place] notified.

## 2011-09-04 NOTE — ED Notes (Signed)
Pt has hx of kidney stones and is c/o right flank and RLQ pain since 3a. Difficulty urinating.

## 2011-09-04 NOTE — Discharge Instructions (Signed)

## 2011-09-04 NOTE — ED Notes (Signed)
rx x 2 for cipro and norco- pt has a ride

## 2011-09-05 NOTE — ED Provider Notes (Signed)
Medical screening examination/treatment/procedure(s) were performed by non-physician practitioner and as supervising physician I was immediately available for consultation/collaboration.  Courney Garrod, MD 09/05/11 0037 

## 2011-09-07 ENCOUNTER — Ambulatory Visit
Admission: RE | Admit: 2011-09-07 | Discharge: 2011-09-07 | Disposition: A | Discharge status: Home / Self Care | Payer: Commercial Indemnity | Source: Ambulatory Visit | Attending: Family Medicine | Admitting: Family Medicine

## 2011-09-07 DIAGNOSIS — Z1231 Encounter for screening mammogram for malignant neoplasm of breast: Secondary | ICD-10-CM

## 2011-11-17 ENCOUNTER — Other Ambulatory Visit: Payer: Self-pay | Admitting: Obstetrics and Gynecology

## 2012-05-26 ENCOUNTER — Emergency Department
Admission: EM | Admit: 2012-05-26 | Discharge: 2012-05-26 | Disposition: A | Discharge status: Home / Self Care | Payer: Commercial Indemnity | Source: Home / Self Care

## 2012-05-26 ENCOUNTER — Encounter: Payer: Self-pay | Admitting: *Deleted

## 2012-05-26 ENCOUNTER — Emergency Department (INDEPENDENT_AMBULATORY_CARE_PROVIDER_SITE_OTHER): Payer: 59

## 2012-05-26 DIAGNOSIS — E119 Type 2 diabetes mellitus without complications: Secondary | ICD-10-CM

## 2012-05-26 DIAGNOSIS — R05 Cough: Secondary | ICD-10-CM

## 2012-05-26 DIAGNOSIS — J069 Acute upper respiratory infection, unspecified: Secondary | ICD-10-CM

## 2012-05-26 DIAGNOSIS — J40 Bronchitis, not specified as acute or chronic: Secondary | ICD-10-CM

## 2012-05-26 HISTORY — DX: Hyperlipidemia, unspecified: E78.5

## 2012-05-26 MED ORDER — AZITHROMYCIN 250 MG PO TABS: ORAL_TABLET | ORAL | Status: DC

## 2012-05-26 MED ORDER — METHYLPREDNISOLONE SODIUM SUCC 125 MG IJ SOLR
125.0000 mg | Freq: Once | INTRAMUSCULAR | Status: AC
  Administered 2012-05-26: 125 mg via INTRAMUSCULAR

## 2012-05-26 NOTE — ED Provider Notes (Signed)
History     CSN: [id number]  Arrival date & time 05/26/12  0846   First MD Initiated Contact with Patient 05/26/12 [phone]      Chief Complaint  Patient presents with  . Back Pain  . Headache   HPI URI Symptoms Onset: 1 month  Description: rhinorrhea, nasal drainage, cough, pleuritic CP Modifying factors:  Diabetic. Last A1C around 8 per pt.   Symptoms Nasal discharge: mild Fever: no Sore throat: no Cough: yes Wheezing: no Ear pain: no GI symptoms: NBNB emesis x 1 yesterday. Consistency of mucous.  Sick contacts: yes  Red Flags  Stiff neck: no Dyspnea: pain with deep breathing.  Rash: no Swallowing difficulty: no  Sinusitis Risk Factors Headache/face pain: no Double sickening: no tooth pain: no  Allergy Risk Factors Sneezing: no Itchy scratchy throat: no Seasonal symptoms: no  Flu Risk Factors Headache: no muscle aches: no severe fatigue: no   Past Medical History  Diagnosis Date  . Kidney stones   . Diabetes mellitus   . Hyperlipidemia     Past Surgical History  Procedure Date  . Cesarean section   . Ablation     Family History  Problem Relation Age of Onset  . Diabetes Mother   . Diabetes Father   . Heart disease Father     History  Substance Use Topics  . Smoking status: Former Smoker    Types: Cigarettes    Quit date: 05/26/2006  . Smokeless tobacco: Not on file  . Alcohol Use: No    OB History    Grav Para Term Preterm Abortions TAB SAB Ect Mult Living                  Review of Systems  All other systems reviewed and are negative.    Allergies  Review of patient's allergies indicates no known allergies.  Home Medications   Current Outpatient Rx  Name  Route  Sig  Dispense  Refill  . ASPIRIN EC 81 MG PO TBEC   Oral   Take 81 mg by mouth daily.         . ATENOLOL 50 MG PO TABS   Oral   Take 50 mg by mouth daily.         . ATORVASTATIN CALCIUM 80 MG PO TABS   Oral   Take 80 mg by mouth daily.         Marland Kitchen  CINNAMON 500 MG PO TABS   Oral   Take 1,000 mg by mouth daily.         . OMEGA-3 FATTY ACIDS 1000 MG PO CAPS   Oral   Take 2 g by mouth daily.         Marland Kitchen GLIMEPIRIDE 4 MG PO TABS   Oral   Take 4 mg by mouth daily before breakfast.         . LEVEMIR    Subcutaneous   Inject 5 Units into the skin every morning.          Marland Kitchen METFORMIN HCL 1000 MG PO TABS   Oral   Take 1,000 mg by mouth 2 (two) times daily with a meal.         . RED YEAST RICE 600 MG PO CAPS   Oral   Take 1,200 mg by mouth daily.           BP 132/85  Pulse 73  Temp 97.6 F (36.4 C) (Oral)  Resp 18  Ht 5'  4" (1.626 m)  Wt 227 lb (102.967 kg)  BMI 38.96 kg/m2  SpO2 100%  Physical Exam  Constitutional: She appears well-developed.       Obese    HENT:  Head: Normocephalic.  Right Ear: External ear normal.  Left Ear: External ear normal.       +nasal erythema, rhinorrhea bilaterally, + post oropharyngeal erythema    Eyes: Conjunctivae normal are normal. Pupils are equal, round, and reactive to light.  Neck: Normal range of motion. Neck supple.  Cardiovascular: Normal rate, regular rhythm and normal heart sounds.   Pulmonary/Chest: Effort normal and breath sounds normal.       No chest wall TTP Very faint wheezes    Abdominal: Soft. Bowel sounds are normal.  Musculoskeletal: Normal range of motion.  Lymphadenopathy:    She has no cervical adenopathy.  Neurological: She is alert.  Skin: Skin is warm.    ED Course  Procedures (including critical care time)  Labs Reviewed - No data to display Dg Chest 2 View  05/26/2012  *RADIOLOGY REPORT*  Clinical Data: Cough.  CHEST - 2 VIEW  Comparison: 03/18/2011  Findings: Heart and mediastinal contours are within normal limits. No focal opacities or effusions.  No acute bony abnormality.  IMPRESSION: No active cardiopulmonary disease.   Original Report Authenticated By: Charlett Nose, M.D.      1. URI (upper respiratory infection)   2.  Bronchitis   3. Diabetes       MDM  Solumedrol 125mg  im x1 given mild wheezing.  Discussed titration of levemir accordingly for secondary hyperglycemia.  Zpak for atypical coverage given duration of sxs.  Follow up with PCP early next week.      The patient and/or caregiver has been counseled thoroughly with regard to treatment plan and/or medications prescribed including dosage, schedule, interactions, rationale for use, and possible side effects and they verbalize understanding. Diagnoses and expected course of recovery discussed and will return if not improved as expected or if the condition worsens. Patient and/or caregiver verbalized understanding.             Doree Albee, MD 05/26/12 1055

## 2012-05-26 NOTE — ED Notes (Signed)
Patient reports recent hx of URI with severe cough, which has resolved. Since, she has had scapular pain intermittent, severe at times x 2 weeks. Last night she developed nausea with vomiting and a HA this AM. Denies SOB

## 2012-10-24 ENCOUNTER — Other Ambulatory Visit: Payer: Self-pay

## 2012-10-24 DIAGNOSIS — Z1231 Encounter for screening mammogram for malignant neoplasm of breast: Secondary | ICD-10-CM

## 2012-11-24 ENCOUNTER — Ambulatory Visit: Payer: 59

## 2012-11-24 ENCOUNTER — Ambulatory Visit
Admission: RE | Admit: 2012-11-24 | Discharge: 2012-11-24 | Disposition: A | Discharge status: Home / Self Care | Payer: BC Managed Care – PPO | Source: Ambulatory Visit

## 2012-11-24 DIAGNOSIS — Z1231 Encounter for screening mammogram for malignant neoplasm of breast: Secondary | ICD-10-CM

## 2013-01-30 ENCOUNTER — Other Ambulatory Visit: Payer: Self-pay | Admitting: Obstetrics and Gynecology

## 2013-02-23 ENCOUNTER — Encounter: Payer: Self-pay | Admitting: Internal Medicine

## 2013-02-23 ENCOUNTER — Ambulatory Visit (INDEPENDENT_AMBULATORY_CARE_PROVIDER_SITE_OTHER): Payer: BC Managed Care – PPO | Admitting: Internal Medicine

## 2013-02-23 VITALS — BP 108/74 | HR 58 | Ht 64.0 in | Wt 231.5 lb

## 2013-02-23 DIAGNOSIS — R011 Cardiac murmur, unspecified: Secondary | ICD-10-CM

## 2013-02-23 DIAGNOSIS — R0609 Other forms of dyspnea: Secondary | ICD-10-CM

## 2013-02-23 DIAGNOSIS — R0989 Other specified symptoms and signs involving the circulatory and respiratory systems: Secondary | ICD-10-CM

## 2013-02-23 NOTE — Progress Notes (Signed)
OFFICE NOTE  Chief Complaint:  Murmur, dyspnea on exertion  Primary Care Physician: No primary provider on file.  HPI:  Laura [name] is a pleasant 47 year old female with no particular cardiac history. She does have dyslipidemia, obesity, and some anxiety. She takes a beta blocker to help with headaches.  She reports that she's had a murmur for the greater part of her adult life. This is previously diagnosed by her primary care provider. Recently she was seen by [facility] gynec[facility] at [facility] and was inquiring about her murmur. They had recommended that she have it evaluated. She does report some mild shortness of breath with exertion, which he attributes to being out of shape. She is doing some exercise to work on weight loss but it is sporadic. She denies any chest pain that sounds cardiac, but occasionally does get sharp brief chest pains in the left axillary area. She is still premenopausal.   PMHx:  Past Medical History  Diagnosis Date  . Kidney stones   . Diabetes mellitus   . Hyperlipidemia     Past Surgical History  Procedure Laterality Date  . Cesarean section    . Ablation      FAMHx:  Family History  Problem Relation Age of Onset  . Diabetes Mother   . Diabetes Father   . Heart disease Father    Father has a pacemaker.  SOCHx:   reports that she quit smoking about 6 years ago. Her smoking use included Cigarettes. She smoked 0.00 packs per day. She does not have any smokeless tobacco history on file. She reports that she does not drink alcohol or use illicit drugs.  ALLERGIES:  No Known Allergies  ROS: A comprehensive review of systems was negative except for: Cardiovascular: positive for Sharp chest pain, murmur, dyspnea on exertion  HOME MEDS: Current Outpatient Prescriptions  Medication Sig Dispense Refill  . aspirin EC 81 MG tablet Take 81 mg by mouth daily.      Marland Kitchen atenolol (TENORMIN) 50 MG tablet Take 50 mg by mouth daily.      Marland Kitchen atorvastatin  (LIPITOR) 80 MG tablet Take 80 mg by mouth daily.      . Cinnamon 500 MG TABS Take 1,000 mg by mouth daily.      . fish oil-omega-3 fatty acids 1000 MG capsule Take 2 g by mouth daily.      Marland Kitchen glimepiride (AMARYL) 4 MG tablet Take 4 mg by mouth daily before breakfast.      . Insulin Detemir (LEVEMIR [place]) Inject 45 Units into the skin at bedtime.       . metFORMIN (GLUCOPHAGE) 1000 MG tablet Take 1,000 mg by mouth 2 (two) times daily with a meal.       No current facility-administered medications for this visit.    LABS/IMAGING: No results found for this or any previous visit (from the past 48 hour(s)). No results found.  VITALS: BP 108/74  Pulse 58  Ht 5\' 4"  (1.626 m)  Wt 231 lb 8 oz (105.008 kg)  BMI 39.72 kg/m2  EXAM: General appearance: alert and no distress Neck: no adenopathy, no carotid bruit, no JVD, supple, symmetrical, trachea midline and thyroid not enlarged, symmetric, no tenderness/mass/nodules Lungs: clear to auscultation bilaterally Heart: regular rate and rhythm, S1, S2 normal, systolic murmur: holosystolic 3/6, crescendo at apex and no click Abdomen: soft, non-tender; bowel sounds normal; no masses,  no organomegaly Extremities: extremities normal, atraumatic, no cyanosis or edema Pulses: 2+ and symmetric Skin: Skin  color, texture, turgor normal. No rashes or lesions Neurologic: Grossly normal  EKG: Sinus bradycardia with PACs at 58  ASSESSMENT: 1. Systolic murmur suspicious for mitral regurgitation 2. Dyspnea on exertion 3. Isolated PACs-asymptomatic on atenolol  PLAN: 1.   Laura [name] has a murmur which sounds like mitral regurgitation. She feels she is generally asymptomatic with this, but does get short of breath with exertion. She does attribute this to deconditioning and being overweight. She is interested in further understanding her murmur and shortness of breath for an echocardiogram as a good way to evaluate this. I'll plan to contact her with the  results of the echocardiogram and if it's markedly abnormal we may need to see her back. I do think the murmur sounds fairly benign and her symptoms of shortness of breath are not likely attributable to it.  Thank you for the kind referral.  Chrystie Nose, MD, [facility] Attending Cardiologist [facility]  Laura [name] 02/23/2013, 2:32 PM

## 2013-02-23 NOTE — Patient Instructions (Addendum)
Your physician has requested that you have an echocardiogram. Echocardiography is a painless test that uses sound waves to create images of your heart. It provides your doctor with information about the size and shape of your heart and how well your heart's chambers and valves are working. This procedure takes approximately one hour. There are no restrictions for this procedure.  Your physician recommends that you schedule a follow-up appointment as needed.  

## 2013-03-16 ENCOUNTER — Ambulatory Visit: Payer: BC Managed Care – PPO | Admitting: Endocrinology

## 2013-03-16 ENCOUNTER — Encounter: Payer: Self-pay | Admitting: Internal Medicine

## 2013-03-21 ENCOUNTER — Telehealth (HOSPITAL_COMMUNITY): Payer: Self-pay | Admitting: Internal Medicine

## 2013-04-05 ENCOUNTER — Telehealth (HOSPITAL_COMMUNITY): Payer: Self-pay | Admitting: *Deleted

## 2013-04-06 ENCOUNTER — Ambulatory Visit (HOSPITAL_COMMUNITY): Payer: BC Managed Care – PPO

## 2013-04-13 ENCOUNTER — Ambulatory Visit (INDEPENDENT_AMBULATORY_CARE_PROVIDER_SITE_OTHER): Payer: BC Managed Care – PPO | Admitting: Endocrinology

## 2013-04-13 ENCOUNTER — Encounter: Payer: Self-pay | Admitting: Endocrinology

## 2013-04-13 VITALS — BP 124/80 | HR 57 | Wt 234.0 lb

## 2013-04-13 DIAGNOSIS — R51 Headache: Secondary | ICD-10-CM

## 2013-04-13 DIAGNOSIS — E785 Hyperlipidemia, unspecified: Secondary | ICD-10-CM

## 2013-04-13 MED ORDER — CANAGLIFLOZIN 300 MG PO TABS: 1.0000 | ORAL_TABLET | Freq: Every day | ORAL | Status: DC

## 2013-04-13 NOTE — Patient Instructions (Addendum)
good diet and exercise habits significanly improve the control of your diabetes.  please let me know if you wish to be referred to a dietician.  high blood sugar is very risky to your health.  you should see an eye doctor every year.  You are at higher than average risk for pneumonia and hepatitis-B.  You should be vaccinated against both.   controlling your blood pressure and cholesterol drastically reduces the damage diabetes does to your body.  this also applies to quitting smoking.  please discuss these with your doctor.  check your blood sugar once a day.  vary the time of day when you check, between before the 3 meals, and at bedtime.  also check if you have symptoms of your blood sugar being too high or too low.  please keep a record of the readings and bring it to your next appointment here.  please call us sooner if your blood sugar goes below 70, or if you have a lot of readings over 200. i have sent a prescription to your pharmacy, to add "invokana."  Please drink plenty of fluids while on this.  Our goal is to get your blood sugar to the low-100's.  Please call next week if this does not happen.   Please come back for a follow-up appointment in January.

## 2013-04-13 NOTE — Progress Notes (Signed)
Subjective:    Patient ID: Laura [name], female    DOB: Nov 28, 1965, 47 y.o.   MRN: [id number]  HPI pt states DM was dx'ed in 2004 (she had gestational DM in 2002); she has mild neuropathy of the lower extremities, but no associated chronic complications.  he has been on insulin x 6 months.  pt says her diet and exercise are "fair." She has slight tingling of the feet, and assoc numbness. She ran out of insulin 1 month ago.  Since then, most cbg's are in the high-100's.  She wants to see if her DM can be managed with orals only. Past Medical History  Diagnosis Date  . Kidney stones   . Diabetes mellitus   . Hyperlipidemia     Past Surgical History  Procedure Laterality Date  . Cesarean section    . Ablation      History   Social History  . Marital Status: Single    Spouse Name: N/A    Number of Children: N/A  . Years of Education: N/A   Occupational History  . Not on file.   Social History Main Topics  . Smoking status: Former Smoker    Types: Cigarettes    Quit date: 05/26/2006  . Smokeless tobacco: Not on file  . Alcohol Use: No  . Drug Use: No  . Sexual Activity: No   Other Topics Concern  . Not on file   Social History Narrative  . No narrative on file    Current Outpatient Prescriptions on File Prior to Visit  Medication Sig Dispense Refill  . aspirin EC 81 MG tablet Take 81 mg by mouth daily.      Marland Kitchen atenolol (TENORMIN) 50 MG tablet Take 50 mg by mouth daily.      Marland Kitchen atorvastatin (LIPITOR) 80 MG tablet Take 80 mg by mouth daily.      . Cinnamon 500 MG TABS Take 1,000 mg by mouth daily.      . fish oil-omega-3 fatty acids 1000 MG capsule Take 2 g by mouth daily.      Marland Kitchen glimepiride (AMARYL) 4 MG tablet Take 4 mg by mouth daily before breakfast.      . Insulin Detemir (LEVEMIR [place]) Inject 45 Units into the skin at bedtime.       . metFORMIN (GLUCOPHAGE) 1000 MG tablet Take 1,000 mg by mouth 2 (two) times daily with a meal.       No current facility-administered  medications on file prior to visit.    No Known Allergies  Family History  Problem Relation Age of Onset  . Diabetes Mother   . Diabetes Father   . Heart disease Father    BP 124/80  Pulse 57  Wt 234 lb (106.142 kg)  BMI 40.15 kg/m2  SpO2 98%  Review of Systems denies weight loss, blurry vision, chest pain, sob, n/v, urinary frequency, cramps, excessive diaphoresis, difficulty with concentration, depression, rhinorrhea, and easy bruising.  No change in chronic headache.  She has menses approx every month.      Objective:   Physical Exam VS: see vs page GEN: no distress HEAD: head: no deformity eyes: no periorbital swelling, no proptosis external nose and ears are normal mouth: no lesion seen NECK: supple, thyroid is not enlarged CHEST WALL: no deformity LUNGS:  Clear to auscultation CV: reg rate and rhythm; soft systolic murmur ABD: abdomen is soft, nontender.  no hepatosplenomegaly.  not distended.  no hernia MUSCULOSKELETAL: muscle bulk and strength are  grossly normal.  no obvious joint swelling.  gait is normal and steady PULSES: no carotid bruit NEURO:  cn 2-12 grossly intact.   readily moves all 4's.  SKIN:  Normal texture and temperature.  No rash or suspicious lesion is visible.   NODES:  None palpable at the neck PSYCH: alert, oriented x3.  Does not appear anxious nor depressed.     Assessment & Plan:  DM: she needs increased rx.  We discussed the nine oral agents available for type 2 diabetes.  This regimen starts with her current meds, and we chose add-ons.  If the addition of invokana does not control DM, we can add [place]. neuropathy, prob due to DM.

## 2013-04-14 DIAGNOSIS — R519 Headache, unspecified: Secondary | ICD-10-CM | POA: Insufficient documentation

## 2013-04-14 DIAGNOSIS — R51 Headache: Secondary | ICD-10-CM | POA: Insufficient documentation

## 2013-04-14 DIAGNOSIS — E785 Hyperlipidemia, unspecified: Secondary | ICD-10-CM | POA: Insufficient documentation

## 2013-05-08 ENCOUNTER — Telehealth: Payer: Self-pay | Admitting: Endocrinology

## 2013-05-08 NOTE — Telephone Encounter (Signed)
No, this is not a side-effect of the medication.  Please ask your pcp about the symptoms.

## 2013-05-08 NOTE — Telephone Encounter (Signed)
Left message on vm

## 2013-05-08 NOTE — Telephone Encounter (Signed)
sugars are good less than 150 pt is on invokana and is having rt sided kidney pain please advise does she need to stop the meds

## 2013-08-17 ENCOUNTER — Ambulatory Visit: Payer: BC Managed Care – PPO | Admitting: Endocrinology

## 2013-08-24 ENCOUNTER — Ambulatory Visit (INDEPENDENT_AMBULATORY_CARE_PROVIDER_SITE_OTHER): Payer: BC Managed Care – PPO | Admitting: Endocrinology

## 2013-08-24 ENCOUNTER — Encounter: Payer: Self-pay | Admitting: Endocrinology

## 2013-08-24 VITALS — BP 122/70 | HR 65 | Temp 98.3°F | Ht 64.0 in | Wt 226.0 lb

## 2013-08-24 DIAGNOSIS — E119 Type 2 diabetes mellitus without complications: Secondary | ICD-10-CM | POA: Insufficient documentation

## 2013-08-24 LAB — HEMOGLOBIN A1C: Hgb A1c MFr Bld: 7.2 % — ABNORMAL HIGH (ref 4.6–6.5)

## 2013-08-24 NOTE — Patient Instructions (Addendum)
check your blood sugar once a day.  vary the time of day when you check, between before the 3 meals, and at bedtime.  also check if you have symptoms of your blood sugar being too high or too low.  please keep a record of the readings and bring it to your next appointment here.  please call us sooner if your blood sugar goes below 70, or if you have a lot of readings over 200. blood tests are being requested for you today.  We'll contact you with results.   If it is high, we'll add "[place]."   Our goal is to get your blood sugar to the low-100's.  Please call next week if this does not happen.   Please come back for a follow-up appointment in 3 months.

## 2013-08-24 NOTE — Progress Notes (Signed)
   Subjective:    Patient ID: Ralph LeydenDonna Estrin, female    DOB: [birth year], 48 y.o.   MRN: [id number]  HPI pt returns for f/u of type 2 DM (dx'ed 2004 (she had gestational DM in 2002); she has mild neuropathy of the lower extremities, but no associated chronic complications; she was started on insulin in early 2014, and stopped later in 2004. She wants to see if her DM can be managed with orals only.  no cbg record, but states cbg's vary from 150-200.  pt states she feels well in general. Past Medical History  Diagnosis Date  . Kidney stones   . Diabetes mellitus   . Hyperlipidemia     Past Surgical History  Procedure Laterality Date  . Cesarean section    . Ablation      History   Social History  . Marital Status: Single    Spouse Name: N/A    Number of Children: N/A  . Years of Education: N/A   Occupational History  . Not on file.   Social History Main Topics  . Smoking status: Former Smoker    Types: Cigarettes    Quit date: 05/26/2006  . Smokeless tobacco: Not on file  . Alcohol Use: No  . Drug Use: No  . Sexual Activity: No   Other Topics Concern  . Not on file   Social History Narrative  . No narrative on file    Current Outpatient Prescriptions on File Prior to Visit  Medication Sig Dispense Refill  . aspirin EC 81 MG tablet Take 81 mg by mouth daily.      Marland Kitchen. atenolol (TENORMIN) 50 MG tablet Take 50 mg by mouth daily.      Marland Kitchen. atorvastatin (LIPITOR) 80 MG tablet Take 80 mg by mouth daily.      . Canagliflozin (INVOKANA) 300 MG TABS Take 1 tablet (300 mg total) by mouth daily.  30 tablet  11  . Cinnamon 500 MG TABS Take 1,000 mg by mouth daily.      . fish oil-omega-3 fatty acids 1000 MG capsule Take 2 g by mouth daily.      Marland Kitchen. glimepiride (AMARYL) 4 MG tablet Take 4 mg by mouth daily before breakfast.      . metFORMIN (GLUCOPHAGE) 1000 MG tablet Take 1,000 mg by mouth 2 (two) times daily with a meal.       No current facility-administered medications on file prior  to visit.    No Known Allergies  Family History  Problem Relation Age of Onset  . Diabetes Mother   . Diabetes Father   . Heart disease Father     BP 122/70  Pulse 65  Temp(Src) 98.3 F (36.8 C) (Oral)  Ht 5\' 4"  (1.626 m)  Wt 226 lb (102.513 kg)  BMI 38.77 kg/m2  SpO2 98%  Review of Systems denies hypoglycemia and weight change    Objective:   Physical Exam VITAL SIGNS:  See vs page GENERAL: no distress  Lab Results  Component Value Date   HGBA1C 7.2* 08/24/2013      Assessment & Plan:  DM: this is the best control this pt should aim for, given this sulfonylurea-containing regimen.   Neuropathy, due to DM. Morbid obesity: this complicates the rx of DM

## 2013-09-14 ENCOUNTER — Encounter: Payer: Self-pay | Admitting: Endocrinology

## 2013-09-26 ENCOUNTER — Encounter: Payer: Self-pay | Admitting: Endocrinology

## 2013-09-27 ENCOUNTER — Other Ambulatory Visit: Payer: Self-pay | Admitting: *Deleted

## 2013-09-27 MED ORDER — GLIMEPIRIDE 4 MG PO TABS: 4.0000 mg | ORAL_TABLET | Freq: Every day | ORAL | Status: DC

## 2013-09-27 NOTE — Telephone Encounter (Signed)
Rx was sent to pt's pharmacy.  

## 2013-09-27 NOTE — Telephone Encounter (Signed)
Rx sent to pt's pharmacy

## 2013-10-09 ENCOUNTER — Encounter: Payer: Self-pay | Admitting: Endocrinology

## 2013-10-09 ENCOUNTER — Other Ambulatory Visit: Payer: Self-pay

## 2013-10-09 MED ORDER — METFORMIN HCL 1000 MG PO TABS: 1000.0000 mg | ORAL_TABLET | Freq: Two times a day (BID) | ORAL | Status: DC

## 2013-10-12 IMAGING — CR DG CHEST 2V
2 series · 2 of 2 positions shown · non-contrast
Comparison: 03/18/2011

CLINICAL DATA: Cough.

CHEST - 2 VIEW

[view not recorded (1 of 2)]
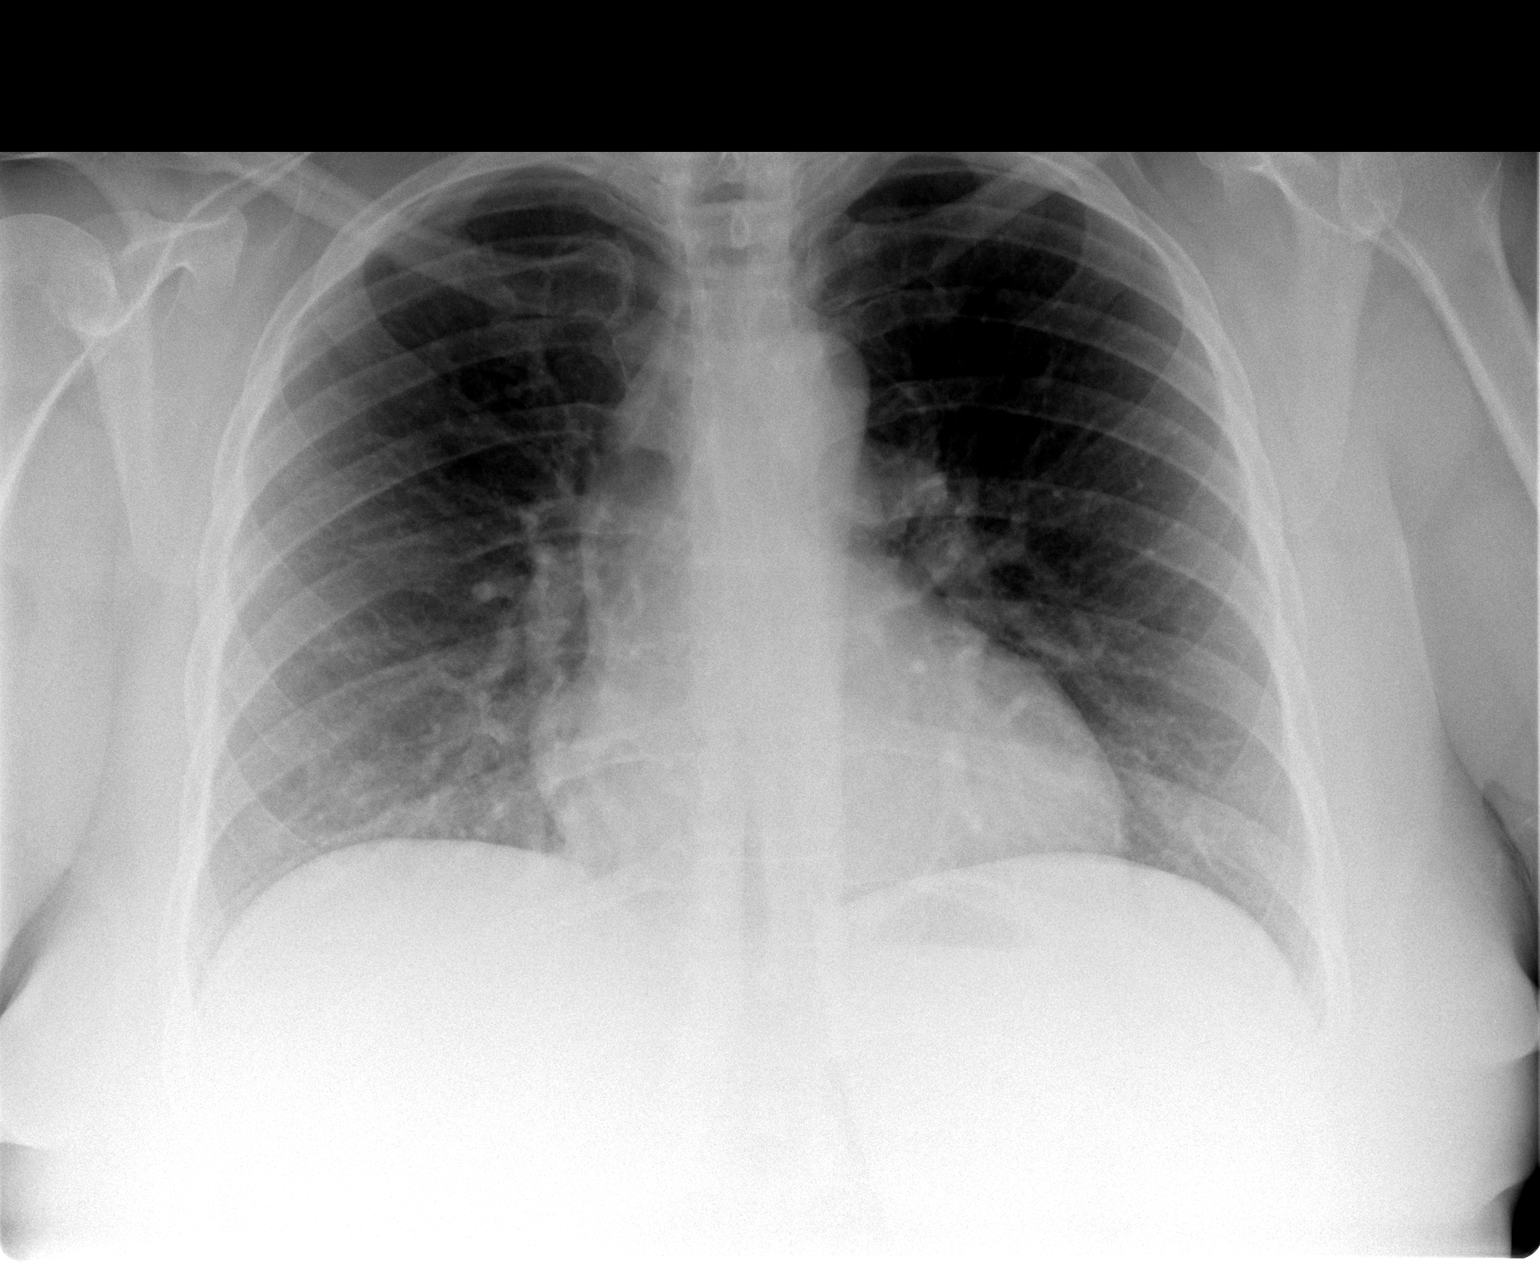

[view not recorded (2 of 2)]
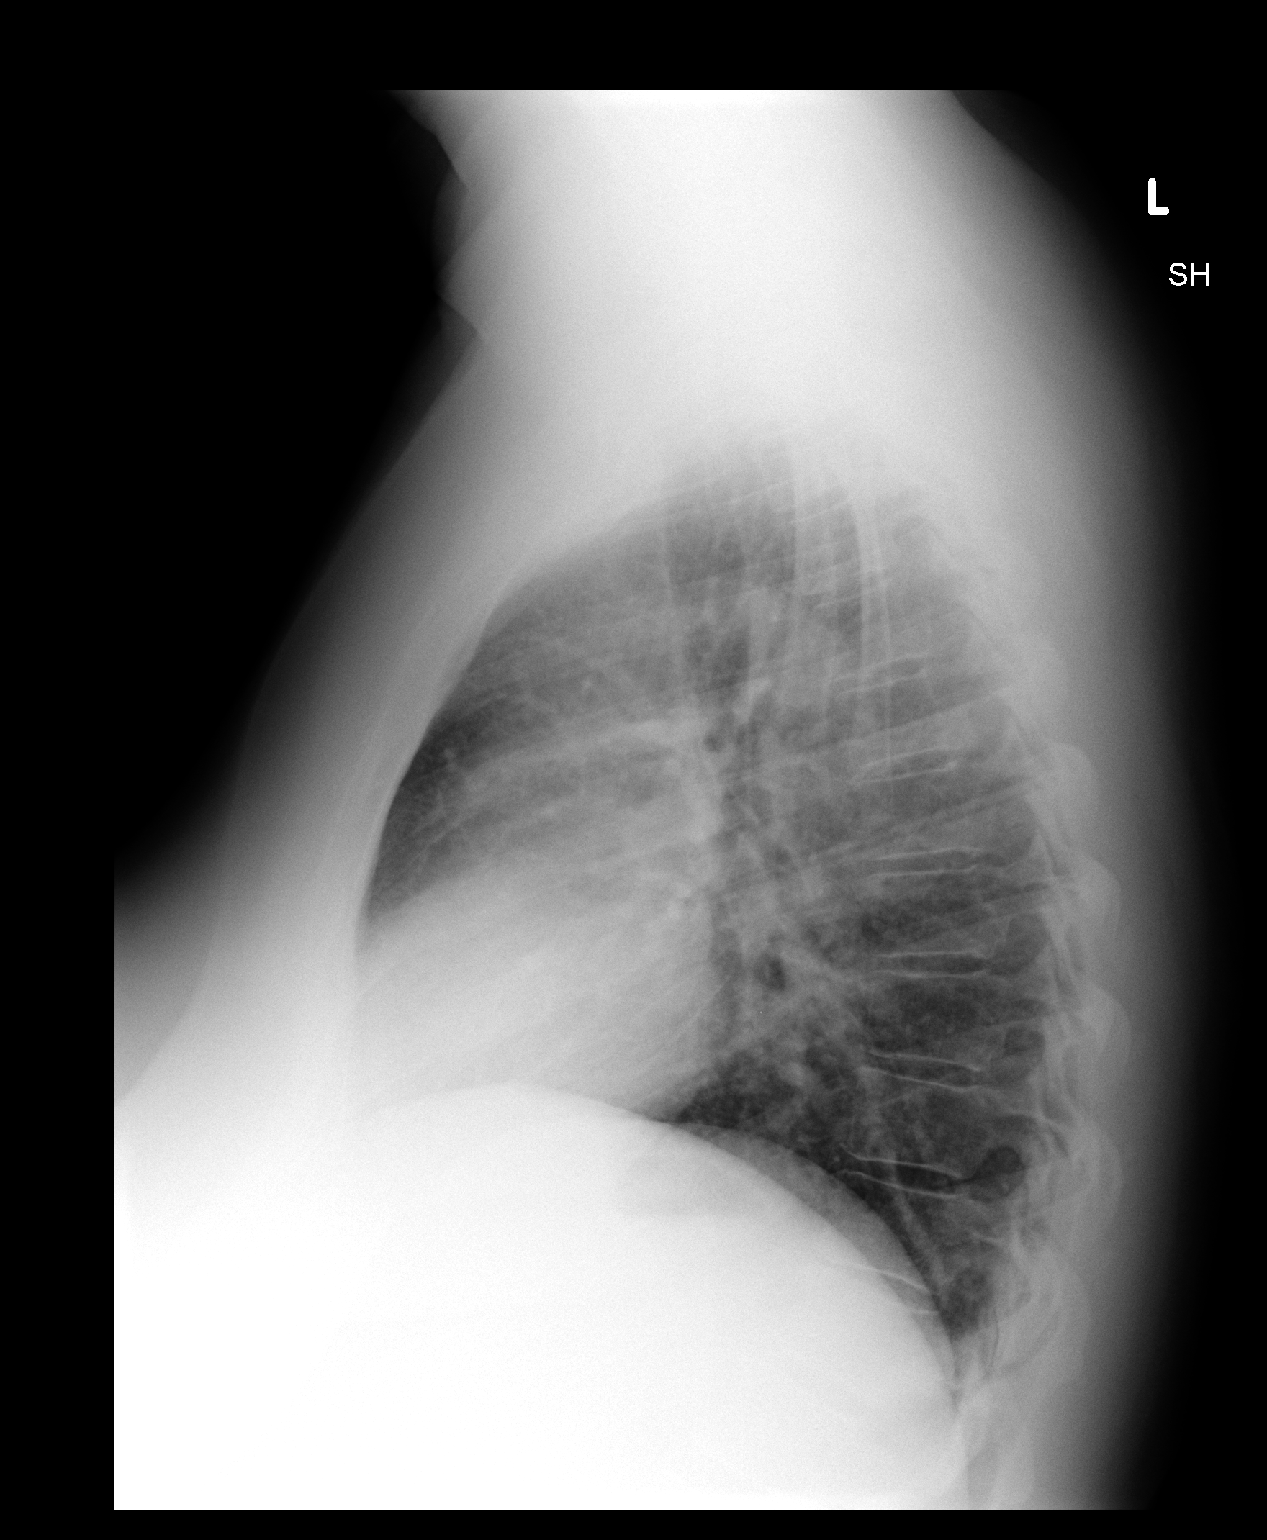

[2 of 2 positions shown; findings below may reference images not displayed]

FINDINGS: Heart and mediastinal contours are within normal limits.
No focal opacities or effusions.  No acute bony abnormality.
IMPRESSION: No active cardiopulmonary disease.

## 2013-11-04 ENCOUNTER — Encounter: Payer: Self-pay | Admitting: Endocrinology

## 2013-11-05 ENCOUNTER — Encounter: Payer: Self-pay | Admitting: Endocrinology

## 2013-11-05 ENCOUNTER — Other Ambulatory Visit: Payer: Self-pay | Admitting: Endocrinology

## 2013-11-05 MED ORDER — DAPAGLIFLOZIN PROPANEDIOL 10 MG PO TABS: 1.0000 | ORAL_TABLET | Freq: Every day | ORAL | Status: DC

## 2013-11-23 ENCOUNTER — Encounter: Payer: Self-pay | Admitting: Endocrinology

## 2013-11-23 ENCOUNTER — Ambulatory Visit (INDEPENDENT_AMBULATORY_CARE_PROVIDER_SITE_OTHER): Payer: BC Managed Care – PPO | Admitting: Endocrinology

## 2013-11-23 VITALS — BP 122/82 | HR 54 | Temp 97.9°F | Wt 224.0 lb

## 2013-11-23 DIAGNOSIS — E119 Type 2 diabetes mellitus without complications: Secondary | ICD-10-CM

## 2013-11-23 LAB — BASIC METABOLIC PANEL
BUN: 21 mg/dL (ref 6–23)
CHLORIDE: 106 meq/L (ref 96–112)
CO2: 25 meq/L (ref 19–32)
Calcium: 9.8 mg/dL (ref 8.4–10.5)
Creatinine, Ser: 0.7 mg/dL (ref 0.4–1.2)
GFR: 94.9 mL/min (ref >60.00)
Glucose, Bld: 101 mg/dL — ABNORMAL HIGH (ref 70–99)
POTASSIUM: 3.7 meq/L (ref 3.5–5.1)
Sodium: 140 mEq/L (ref 135–145)

## 2013-11-23 LAB — MICROALBUMIN / CREATININE URINE RATIO
CREATININE, U: 59.4 mg/dL
MICROALB UR: 0.6 mg/dL (ref 0.0–1.9)
MICROALB/CREAT RATIO: 1 mg/g (ref 0.0–30.0)

## 2013-11-23 LAB — HEMOGLOBIN A1C: HEMOGLOBIN A1C: 7.5 % — AB (ref 4.6–6.5)

## 2013-11-23 LAB — TSH: TSH: 0.44 u[IU]/mL (ref 0.35–4.50)

## 2013-11-23 NOTE — Patient Instructions (Addendum)
check your blood sugar once a day.  vary the time of day when you check, between before the 3 meals, and at bedtime.  also check if you have symptoms of your blood sugar being too high or too low.  please keep a record of the readings and bring it to your next appointment here.  please call us sooner if your blood sugar goes below 70, or if you have a lot of readings over 200.  blood tests are being requested for you today.  We'll contact you with results.   Based on there results, let's tentatively plan to reduce the glimepiride, and start "bromocriptine." here is a discount card.   Please come back for a follow-up appointment in 3 months.

## 2013-11-23 NOTE — Progress Notes (Signed)
Subjective:    Patient ID: Laura [name], female    DOB: Oct 08, 1965, 48 y.o.   MRN: [id number]  HPI pt returns for f/u of type 2 DM (dx'ed 2004 (she had gestational DM in 2002); she has mild neuropathy of the lower extremities, but no associated chronic complications; she was started on insulin in early 2014, but later in 2004, she successfully changed back to orals only).  no cbg record, but states cbg's are well-controlled. She has a few mos of slight hair loss throughout the head, but no assoc rash.  Past Medical History  Diagnosis Date  . Kidney stones   . Diabetes mellitus   . Hyperlipidemia     Past Surgical History  Procedure Laterality Date  . Cesarean section    . Ablation      History   Social History  . Marital Status: Single    Spouse Name: N/A    Number of Children: N/A  . Years of Education: N/A   Occupational History  . Not on file.   Social History Main Topics  . Smoking status: Former Smoker    Types: Cigarettes    Quit date: 05/26/2006  . Smokeless tobacco: Not on file  . Alcohol Use: No  . Drug Use: No  . Sexual Activity: No   Other Topics Concern  . Not on file   Social History Narrative  . No narrative on file    Current Outpatient Prescriptions on File Prior to Visit  Medication Sig Dispense Refill  . aspirin EC 81 MG tablet Take 81 mg by mouth daily.      Marland Kitchen. atenolol (TENORMIN) 50 MG tablet Take 50 mg by mouth daily.      Marland Kitchen. atorvastatin (LIPITOR) 80 MG tablet Take 80 mg by mouth daily.      . Cinnamon 500 MG TABS Take 1,000 mg by mouth daily.      . fish oil-omega-3 fatty acids 1000 MG capsule Take 2 g by mouth daily.      Marland Kitchen. glimepiride (AMARYL) 4 MG tablet Take 1 tablet (4 mg total) by mouth daily before breakfast.  30 tablet  2  . metFORMIN (GLUCOPHAGE) 1000 MG tablet Take 1 tablet (1,000 mg total) by mouth 2 (two) times daily with a meal.  180 tablet  2   No current facility-administered medications on file prior to visit.    No  Known Allergies  Family History  Problem Relation Age of Onset  . Diabetes Mother   . Diabetes Father   . Heart disease Father     BP 122/82  Pulse 54  Temp(Src) 97.9 F (36.6 C) (Oral)  Wt 224 lb (101.606 kg)  SpO2 97%  Review of Systems She denies weight change and edema.     Objective:   Physical Exam VITAL SIGNS:  See vs page GENERAL: no distress Head: normal hair distribution.     Lab Results  Component Value Date   HGBA1C 7.5* 11/23/2013   Lab Results  Component Value Date   TSH 0.44 11/23/2013   Lab Results  Component Value Date   CREATININE 0.7 11/23/2013   BUN 21 11/23/2013   NA 140 11/23/2013   K 3.7 11/23/2013   CL 106 11/23/2013   CO2 25 11/23/2013       Assessment & Plan:  DM: she needs increased rx, if it can be done with a regimen that avoids or minimizes hypoglycemia. Morbid obesity: this complicates the rx of DM Hair loss:  not related to her meds.

## 2013-11-24 MED ORDER — BROMOCRIPTINE MESYLATE 0.8 MG PO TABS: 1.0000 | ORAL_TABLET | Freq: Every day | ORAL | Status: DC

## 2013-11-24 MED ORDER — GLIMEPIRIDE 2 MG PO TABS: 2.0000 mg | ORAL_TABLET | Freq: Every day | ORAL | Status: DC

## 2014-03-01 ENCOUNTER — Ambulatory Visit: Payer: BC Managed Care – PPO | Admitting: Endocrinology

## 2014-03-07 ENCOUNTER — Other Ambulatory Visit: Payer: Self-pay | Admitting: Obstetrics and Gynecology

## 2014-03-08 LAB — CYTOLOGY - PAP

## 2014-03-15 ENCOUNTER — Encounter: Payer: Self-pay | Admitting: Endocrinology

## 2014-03-15 ENCOUNTER — Ambulatory Visit (INDEPENDENT_AMBULATORY_CARE_PROVIDER_SITE_OTHER): Payer: BC Managed Care – PPO | Admitting: Endocrinology

## 2014-03-15 VITALS — BP 114/54 | HR 70 | Temp 98.0°F | Ht 64.0 in | Wt 220.0 lb

## 2014-03-15 DIAGNOSIS — E119 Type 2 diabetes mellitus without complications: Secondary | ICD-10-CM

## 2014-03-15 LAB — HEMOGLOBIN A1C: HEMOGLOBIN A1C: 8.9 % — AB (ref 4.6–6.5)

## 2014-03-15 MED ORDER — GLIMEPIRIDE 2 MG PO TABS: 2.0000 mg | ORAL_TABLET | Freq: Every day | ORAL | Status: DC

## 2014-03-15 NOTE — Progress Notes (Signed)
Subjective:    Patient ID: Laura [name], female    DOB: [birth year], 48 y.o.   MRN: [id number]  HPI pt returns for f/u of type 2 DM (dx'ed 2004 (she had GDM in 2002); she has mild neuropathy of the lower extremities, but no associated chronic complications; she was started on insulin in early 2014, but later in 2014, she successfully changed back to orals only; she now takes 4 orals).  no cbg record, but states cbg's vary from 130-170.  There is no trend throughout the day.  She does not take the amaryl.   Past Medical History  Diagnosis Date  . Kidney stones   . Diabetes mellitus   . Hyperlipidemia     Past Surgical History  Procedure Laterality Date  . Cesarean section    . Ablation      History   Social History  . Marital Status: Single    Spouse Name: N/A    Number of Children: N/A  . Years of Education: N/A   Occupational History  . Not on file.   Social History Main Topics  . Smoking status: Former Smoker    Types: Cigarettes    Quit date: 05/26/2006  . Smokeless tobacco: Not on file  . Alcohol Use: No  . Drug Use: No  . Sexual Activity: No   Other Topics Concern  . Not on file   Social History Narrative  . No narrative on file    Current Outpatient Prescriptions on File Prior to Visit  Medication Sig Dispense Refill  . aspirin EC 81 MG tablet Take 81 mg by mouth daily.      Marland Kitchen atenolol (TENORMIN) 50 MG tablet Take 50 mg by mouth daily.      Marland Kitchen atorvastatin (LIPITOR) 80 MG tablet Take 80 mg by mouth daily.      . Bromocriptine Mesylate 0.8 MG TABS Take 1 tablet (0.8 mg total) by mouth daily.  30 tablet  11  . Canagliflozin (INVOKANA) 300 MG TABS Take 1 tablet by mouth daily.       . Cinnamon 500 MG TABS Take 1,000 mg by mouth daily.      . fish oil-omega-3 fatty acids 1000 MG capsule Take 2 g by mouth daily.      . metFORMIN (GLUCOPHAGE) 1000 MG tablet Take 1 tablet (1,000 mg total) by mouth 2 (two) times daily with a meal.  180 tablet  2   No current  facility-administered medications on file prior to visit.    No Known Allergies  Family History  Problem Relation Age of Onset  . Diabetes Mother   . Diabetes Father   . Heart disease Father     BP 114/54  Pulse 70  Temp(Src) 98 F (36.7 C) (Oral)  Ht  (1.626 m)  Wt 220 lb (99.791 kg)  BMI 37.74 kg/m2  SpO2 96%    Review of Systems She denies hypoglycemia.  She has lost a few lbs    Objective:   Physical Exam VITAL SIGNS:  See vs page GENERAL: no distress Pulses: dorsalis pedis intact bilat.   Feet: no deformity. normal color and temp.  no edema.  Left great toenail is absent.  Skin:  no ulcer on the feet.   Neuro: sensation is intact to touch on the feet  Lab Results  Component Value Date   HGBA1C 8.9* 03/15/2014        Assessment & Plan:  DM: moderate exacerbatioJacklin Zwickompliance with cbg recording  and meds, worse: I'll work around this as best I can. Weight-loss, new. This helps glycemic control   Patient is advised the following: Patient Instructions  check your blood sugar once a day.  vary the time of day when you check, between before the 3 meals, and at bedtime.  also check if you have symptoms of your blood sugar being too high or too low.  please keep a record of the readings and bring it to your next appointment here.  please call us sooner if your blood sugar goes below 70, or if you have a lot of readings over 200.   blood tests are being requested for you today.  We'll contact you with results.   Please come back for a follow-up appointment in 3 months.    Please add-back the glimepiride.

## 2014-03-15 NOTE — Patient Instructions (Addendum)
check your blood sugar once a day.  vary the time of day when you check, between before the 3 meals, and at bedtime.  also check if you have symptoms of your blood sugar being too high or too low.  please keep a record of the readings and bring it to your next appointment here.  please call us sooner if your blood sugar goes below 70, or if you have a lot of readings over 200.  blood tests are being requested for you today.  We'll contact you with results.   Please come back for a follow-up appointment in 3 months.   

## 2014-04-30 ENCOUNTER — Encounter: Payer: Self-pay | Admitting: Endocrinology

## 2014-05-01 ENCOUNTER — Other Ambulatory Visit: Payer: Self-pay

## 2014-05-01 MED ORDER — BROMOCRIPTINE MESYLATE 0.8 MG PO TABS: 1.0000 | ORAL_TABLET | Freq: Every day | ORAL | Status: DC

## 2014-05-01 MED ORDER — GLIMEPIRIDE 2 MG PO TABS: 2.0000 mg | ORAL_TABLET | Freq: Every day | ORAL | Status: DC

## 2014-06-21 ENCOUNTER — Ambulatory Visit: Payer: BC Managed Care – PPO | Admitting: Endocrinology

## 2014-07-26 ENCOUNTER — Encounter: Payer: Self-pay | Admitting: Endocrinology

## 2014-07-26 ENCOUNTER — Ambulatory Visit (INDEPENDENT_AMBULATORY_CARE_PROVIDER_SITE_OTHER): Payer: BC Managed Care – PPO | Admitting: Endocrinology

## 2014-07-26 VITALS — BP 112/66 | HR 60 | Temp 97.6°F | Ht 64.0 in | Wt 220.0 lb

## 2014-07-26 DIAGNOSIS — E119 Type 2 diabetes mellitus without complications: Secondary | ICD-10-CM

## 2014-07-26 LAB — HEMOGLOBIN A1C: HEMOGLOBIN A1C: 7.8 % — AB (ref 4.6–6.5)

## 2014-07-26 NOTE — Progress Notes (Signed)
Subjective:    Patient ID: Laura [name], female    DOB: 11-Aug-1965, 49 y.o.   MRN: [id number]  HPI  Pt returns for f/u of diabetes mellitus: DM type: 2 Dx'ed: 2004 Complications: polyneuropathy Therapy: 4 oral meds GDM: 2002 DKA: never Severe hypoglycemia: never Pancreatitis: never Other: she took insulin for a few months in 2014 Interval history: pt states she feels well in general.  She takes meds as rx'ed.  no cbg record, but states cbg's are in the mid-100's.   Past Medical History  Diagnosis Date  . Kidney stones   . Diabetes mellitus   . Hyperlipidemia     Past Surgical History  Procedure Laterality Date  . Cesarean section    . Ablation      History   Social History  . Marital Status: Single    Spouse Name: N/A    Number of Children: N/A  . Years of Education: N/A   Occupational History  . Not on file.   Social History Main Topics  . Smoking status: Former Smoker    Types: Cigarettes    Quit date: 05/26/2006  . Smokeless tobacco: Not on file  . Alcohol Use: No  . Drug Use: No  . Sexual Activity: No   Other Topics Concern  . Not on file   Social History Narrative    Current Outpatient Prescriptions on File Prior to Visit  Medication Sig Dispense Refill  . aspirin EC 81 MG tablet Take 81 mg by mouth daily.    Marland Kitchen atenolol (TENORMIN) 50 MG tablet Take 50 mg by mouth daily.    Marland Kitchen atorvastatin (LIPITOR) 80 MG tablet Take 80 mg by mouth daily.    . Bromocriptine Mesylate 0.8 MG TABS Take 1 tablet (0.8 mg total) by mouth daily. 90 tablet 1  . Canagliflozin (INVOKANA) 300 MG TABS Take 1 tablet by mouth daily.     . Cinnamon 500 MG TABS Take 1,000 mg by mouth daily.    . fish oil-omega-3 fatty acids 1000 MG capsule Take 2 g by mouth daily.    Marland Kitchen glimepiride (AMARYL) 2 MG tablet Take 1 tablet (2 mg total) by mouth daily before breakfast. 90 tablet 1  . metFORMIN (GLUCOPHAGE) 1000 MG tablet Take 1 tablet (1,000 mg total) by mouth 2 (two) times daily with a  meal. 180 tablet 2   No current facility-administered medications on file prior to visit.    No Known Allergies  Family History  Problem Relation Age of Onset  . Diabetes Mother   . Diabetes Father   . Heart disease Father     BP 112/66 mmHg  Pulse 60  Temp(Src) 97.6 F (36.4 C) (Oral)  Ht  (1.626 m)  Wt 220 lb (99.791 kg)  BMI 37.74 kg/m2  SpO2 94%    Review of Systems She denies hypoglycemia and weight change.      Objective:   Physical Exam VITAL SIGNS:  See vs page.  GENERAL: no distress. Pulses: dorsalis pedis intact bilat.   MSK: no deformity of the feet. CV: no leg edema.  Skin:  no ulcer on the feet.  normal color and temp on the feet. Neuro: sensation is intact to touch on the feet.   Ext; left great toenail is surgically absent.    Lab Results  Component Value Date   HGBA1C 7.8* 07/26/2014       Assessment & Plan:  DM: mild exacerbation.  Over time, i would prefer to reduce  or stop the amaryl altogether.  Patient is advised the following: Patient Instructions  check your blood sugar once a day.  vary the time of day when you check, between before the 3 meals, and at bedtime.  also check if you have symptoms of your blood sugar being too high or too low.  please keep a record of the readings and bring it to your next appointment here.  please call us sooner if your blood sugar goes below 70, or if you have a lot of readings over 200.   A diabetes blood test is requested for you today.  We'll contact you with results.   If it is high, we can add "tradjenta."  Please come back for a follow-up appointment in 3 months.      add "onglyza." Your insurance now says this is preferred over tradjenta.

## 2014-07-26 NOTE — Patient Instructions (Addendum)
check your blood sugar once a day.  vary the time of day when you check, between before the 3 meals, and at bedtime.  also check if you have symptoms of your blood sugar being too high or too low.  please keep a record of the readings and bring it to your next appointment here.  please call us sooner if your blood sugar goes below 70, or if you have a lot of readings over 200.   A diabetes blood test is requested for you today.  We'll contact you with results.   If it is high, we can add "tradjenta."  Please come back for a follow-up appointment in 3 months.

## 2014-07-27 MED ORDER — SAXAGLIPTIN HCL 5 MG PO TABS: 5.0000 mg | ORAL_TABLET | Freq: Every day | ORAL | Status: DC

## 2014-10-25 ENCOUNTER — Ambulatory Visit: Payer: BC Managed Care – PPO | Admitting: Endocrinology

## 2014-10-25 ENCOUNTER — Encounter: Payer: Self-pay | Admitting: Endocrinology

## 2014-10-27 ENCOUNTER — Encounter: Payer: Self-pay | Admitting: Endocrinology

## 2014-11-15 ENCOUNTER — Encounter: Payer: Self-pay | Admitting: Endocrinology

## 2014-11-15 ENCOUNTER — Ambulatory Visit (INDEPENDENT_AMBULATORY_CARE_PROVIDER_SITE_OTHER): Payer: 59 | Admitting: Endocrinology

## 2014-11-15 VITALS — BP 126/80 | HR 75 | Temp 98.2°F | Ht 64.0 in

## 2014-11-15 DIAGNOSIS — E119 Type 2 diabetes mellitus without complications: Secondary | ICD-10-CM

## 2014-11-15 LAB — BASIC METABOLIC PANEL
BUN: 27 mg/dL — ABNORMAL HIGH (ref 6–23)
CALCIUM: 9.9 mg/dL (ref 8.4–10.5)
CO2: 27 meq/L (ref 19–32)
CREATININE: 0.62 mg/dL (ref 0.40–1.20)
Chloride: 102 mEq/L (ref 96–112)
GFR: 108.72 mL/min (ref >60.00)
Glucose, Bld: 110 mg/dL — ABNORMAL HIGH (ref 70–99)
Potassium: 3.7 mEq/L (ref 3.5–5.1)
Sodium: 137 mEq/L (ref 135–145)

## 2014-11-15 LAB — HEMOGLOBIN A1C: HEMOGLOBIN A1C: 7.1 % — AB (ref 4.6–6.5)

## 2014-11-15 MED ORDER — METFORMIN HCL 1000 MG PO TABS: 1000.0000 mg | ORAL_TABLET | Freq: Two times a day (BID) | ORAL | Status: DC

## 2014-11-15 NOTE — Progress Notes (Signed)
Subjective:    Patient ID: Laura [name], female    DOB: 1966-04-18, 49 y.o.   MRN: [id number]  HPI Pt returns for f/u of diabetes mellitus: DM type: 2 Dx'ed: 2004 Complications: polyneuropathy Therapy: 5 oral meds GDM: 2002 DKA: never Severe hypoglycemia: never Pancreatitis: never Other: she took insulin for a few months in 2014 Interval history: Pt says she did not tolerate onglyza (myalgias), so she stopped it.  pt states she feels well in general now.   Past Medical History  Diagnosis Date  . Kidney stones   . Diabetes mellitus   . Hyperlipidemia     Past Surgical History  Procedure Laterality Date  . Cesarean section    . Ablation      History   Social History  . Marital Status: Single    Spouse Name: N/A  . Number of Children: N/A  . Years of Education: N/A   Occupational History  . Not on file.   Social History Main Topics  . Smoking status: Former Smoker    Types: Cigarettes    Quit date: 05/26/2006  . Smokeless tobacco: Not on file  . Alcohol Use: No  . Drug Use: No  . Sexual Activity: No   Other Topics Concern  . Not on file   Social History Narrative    Current Outpatient Prescriptions on File Prior to Visit  Medication Sig Dispense Refill  . aspirin EC 81 MG tablet Take 81 mg by mouth daily.    Marland Kitchen atenolol (TENORMIN) 50 MG tablet Take 50 mg by mouth daily.    Marland Kitchen atorvastatin (LIPITOR) 80 MG tablet Take 80 mg by mouth daily.    . Bromocriptine Mesylate 0.8 MG TABS Take 1 tablet (0.8 mg total) by mouth daily. 90 tablet 1  . Canagliflozin (INVOKANA) 300 MG TABS Take 1 tablet by mouth daily.     . Cinnamon 500 MG TABS Take 1,000 mg by mouth daily.    . fish oil-omega-3 fatty acids 1000 MG capsule Take 2 g by mouth daily.    Marland Kitchen glimepiride (AMARYL) 2 MG tablet Take 1 tablet (2 mg total) by mouth daily before breakfast. 90 tablet 1   No current facility-administered medications on file prior to visit.    No Known Allergies  Family History    Problem Relation Age of Onset  . Diabetes Mother   . Diabetes Father   . Heart disease Father     BP 126/80 mmHg  Pulse 75  Temp(Src) 98.2 F (36.8 C) (Oral)  Ht  (1.626 m)  SpO2 95%    Review of Systems She denies hypoglycemia.  She has lost a few lbs.      Objective:   Physical Exam VITAL SIGNS:  See vs page GENERAL: no distress Pulses: dorsalis pedis intact bilat.   MSK: no deformity of the feet CV: no leg edema.   Skin:  no ulcer on the feet.  normal color and temp on the feet.  Neuro: sensation is intact to touch on the feet.   Ext: left great toenail is absent.    Lab Results  Component Value Date   HGBA1C 7.1* 11/15/2014      Assessment & Plan:  DM: this is the best control this pt should aim for, given this sulfonylurea-containing regimen.  Patient is advised the following: Patient Instructions  check your blood sugar once a day.  vary the time of day when you check, between before the 3 meals, and at  bedtime.  also check if you have symptoms of your blood sugar being too high or too low.  please keep a record of the readings and bring it to your next appointment here.  please call us sooner if your blood sugar goes below 70, or if you have a lot of readings over 200.   A diabetes blood test is requested for you today.  We'll contact you with results.   If it is high, we can increase the bromocriptine.   Please come back for a follow-up appointment in 3 months.     addendum: Please continue the same medications

## 2014-11-15 NOTE — Patient Instructions (Addendum)
check your blood sugar once a day.  vary the time of day when you check, between before the 3 meals, and at bedtime.  also check if you have symptoms of your blood sugar being too high or too low.  please keep a record of the readings and bring it to your next appointment here.  please call us sooner if your blood sugar goes below 70, or if you have a lot of readings over 200.   A diabetes blood test is requested for you today.  We'll contact you with results.   If it is high, we can increase the bromocriptine.   Please come back for a follow-up appointment in 3 months.

## 2014-12-10 ENCOUNTER — Encounter: Payer: Self-pay | Admitting: Endocrinology

## 2014-12-10 ENCOUNTER — Other Ambulatory Visit: Payer: Self-pay

## 2014-12-10 MED ORDER — GLIMEPIRIDE 2 MG PO TABS: 2.0000 mg | ORAL_TABLET | Freq: Every day | ORAL | Status: DC

## 2014-12-10 MED ORDER — BROMOCRIPTINE MESYLATE 0.8 MG PO TABS
1.0000 | ORAL_TABLET | Freq: Every day | ORAL | Status: DC
Start: 2014-12-10 — End: 2014-12-10

## 2014-12-10 MED ORDER — BROMOCRIPTINE MESYLATE 0.8 MG PO TABS: 1.0000 | ORAL_TABLET | Freq: Every day | ORAL | Status: DC

## 2014-12-12 ENCOUNTER — Other Ambulatory Visit: Payer: Self-pay

## 2014-12-12 MED ORDER — BROMOCRIPTINE MESYLATE 0.8 MG PO TABS: 1.0000 | ORAL_TABLET | Freq: Every day | ORAL | Status: DC

## 2015-02-06 ENCOUNTER — Encounter: Payer: Self-pay | Admitting: Endocrinology

## 2015-02-06 ENCOUNTER — Other Ambulatory Visit: Payer: Self-pay

## 2015-02-06 MED ORDER — BROMOCRIPTINE MESYLATE 0.8 MG PO TABS: 1.0000 | ORAL_TABLET | Freq: Every day | ORAL | Status: DC

## 2015-02-28 ENCOUNTER — Ambulatory Visit: Payer: BC Managed Care – PPO | Admitting: Endocrinology

## 2015-04-11 ENCOUNTER — Other Ambulatory Visit: Payer: Self-pay | Admitting: Endocrinology

## 2015-04-11 ENCOUNTER — Encounter: Payer: Self-pay | Admitting: Endocrinology

## 2015-04-11 MED ORDER — BROMOCRIPTINE MESYLATE 2.5 MG PO TABS: 1.2500 mg | ORAL_TABLET | Freq: Every day | ORAL | Status: DC

## 2015-04-14 ENCOUNTER — Other Ambulatory Visit: Payer: Self-pay

## 2015-04-14 NOTE — Telephone Encounter (Signed)
Pt calling regarding the good rx coupon we need a to do a rx of #30 so the coupon will work because she has no insurance right now, call into [facility] on [address]

## 2015-04-14 NOTE — Telephone Encounter (Signed)
I tried to contact the pt. Pt was unavailable will try again at a later time.

## 2015-04-18 ENCOUNTER — Other Ambulatory Visit: Payer: Self-pay

## 2015-04-18 MED ORDER — BROMOCRIPTINE MESYLATE 2.5 MG PO TABS: 1.2500 mg | ORAL_TABLET | Freq: Every day | ORAL | Status: DC

## 2015-04-18 NOTE — Telephone Encounter (Signed)
Patient ask if you can resend Brocripimide, [facility] [address] in [place].

## 2015-04-18 NOTE — Telephone Encounter (Signed)
Rx submitted to [facility] per pt's request.

## 2015-04-30 ENCOUNTER — Other Ambulatory Visit: Payer: Self-pay | Admitting: *Deleted

## 2015-04-30 ENCOUNTER — Encounter: Payer: Self-pay | Admitting: Endocrinology

## 2015-04-30 MED ORDER — GLIMEPIRIDE 2 MG PO TABS: 2.0000 mg | ORAL_TABLET | Freq: Every day | ORAL | Status: DC

## 2015-05-01 ENCOUNTER — Other Ambulatory Visit: Payer: Self-pay | Admitting: *Deleted

## 2015-05-01 MED ORDER — GLIMEPIRIDE 2 MG PO TABS: 2.0000 mg | ORAL_TABLET | Freq: Every day | ORAL | Status: DC

## 2015-05-01 NOTE — Telephone Encounter (Signed)
Pt requested her rx to be sent to another pharmacy. Resent.

## 2015-05-20 ENCOUNTER — Ambulatory Visit: Payer: 59 | Admitting: Endocrinology

## 2015-08-07 ENCOUNTER — Emergency Department (HOSPITAL_BASED_OUTPATIENT_CLINIC_OR_DEPARTMENT_OTHER): Payer: 59

## 2015-08-07 ENCOUNTER — Encounter (HOSPITAL_BASED_OUTPATIENT_CLINIC_OR_DEPARTMENT_OTHER): Payer: Self-pay

## 2015-08-07 ENCOUNTER — Emergency Department (HOSPITAL_BASED_OUTPATIENT_CLINIC_OR_DEPARTMENT_OTHER)
Admission: EM | Admit: 2015-08-07 | Discharge: 2015-08-07 | Disposition: A | Discharge status: Home / Self Care | Payer: 59 | Attending: Emergency Medicine | Admitting: Emergency Medicine

## 2015-08-07 DIAGNOSIS — Z7984 Long term (current) use of oral hypoglycemic drugs: Secondary | ICD-10-CM | POA: Insufficient documentation

## 2015-08-07 DIAGNOSIS — Z79899 Other long term (current) drug therapy: Secondary | ICD-10-CM | POA: Insufficient documentation

## 2015-08-07 DIAGNOSIS — Z9851 Tubal ligation status: Secondary | ICD-10-CM | POA: Diagnosis not present

## 2015-08-07 DIAGNOSIS — R109 Unspecified abdominal pain: Secondary | ICD-10-CM | POA: Diagnosis present

## 2015-08-07 DIAGNOSIS — R8299 Other abnormal findings in urine: Secondary | ICD-10-CM | POA: Insufficient documentation

## 2015-08-07 DIAGNOSIS — Z87891 Personal history of nicotine dependence: Secondary | ICD-10-CM | POA: Insufficient documentation

## 2015-08-07 DIAGNOSIS — Z9889 Other specified postprocedural states: Secondary | ICD-10-CM | POA: Diagnosis not present

## 2015-08-07 DIAGNOSIS — Z87442 Personal history of urinary calculi: Secondary | ICD-10-CM | POA: Diagnosis not present

## 2015-08-07 DIAGNOSIS — E785 Hyperlipidemia, unspecified: Secondary | ICD-10-CM | POA: Diagnosis not present

## 2015-08-07 DIAGNOSIS — Z3202 Encounter for pregnancy test, result negative: Secondary | ICD-10-CM | POA: Diagnosis not present

## 2015-08-07 DIAGNOSIS — Z7982 Long term (current) use of aspirin: Secondary | ICD-10-CM | POA: Diagnosis not present

## 2015-08-07 LAB — URINALYSIS, ROUTINE W REFLEX MICROSCOPIC
Bilirubin Urine: NEGATIVE
Hgb urine dipstick: NEGATIVE
KETONES UR: NEGATIVE mg/dL
LEUKOCYTES UA: NEGATIVE
NITRITE: NEGATIVE
PROTEIN: NEGATIVE mg/dL
Specific Gravity, Urine: 1.023 (ref 1.005–1.030)
pH: 6 (ref 5.0–8.0)

## 2015-08-07 LAB — URINE MICROSCOPIC-ADD ON: Squamous Epithelial / LPF: NONE SEEN

## 2015-08-07 LAB — PREGNANCY, URINE: Preg Test, Ur: NEGATIVE

## 2015-08-07 MED ORDER — KETOROLAC TROMETHAMINE 60 MG/2ML IM SOLN
60.0000 mg | Freq: Once | INTRAMUSCULAR | Status: AC
  Administered 2015-08-07: 60 mg via INTRAMUSCULAR
  Filled 2015-08-07: qty 2

## 2015-08-07 MED ORDER — NAPROXEN 500 MG PO TABS: 500.0000 mg | ORAL_TABLET | Freq: Two times a day (BID) | ORAL | Status: DC | PRN

## 2015-08-07 NOTE — ED Provider Notes (Signed)
CSN: [id number]     Arrival date & time 08/07/15  1532 History   First MD Initiated Contact with Patient 08/07/15 1612     Chief Complaint  Patient presents with  . Flank Pain     (Consider location/radiation/quality/duration/timing/severity/associated sxs/prior Treatment) HPI   50 year old female with history of diabetes, hyperlipidemia, history of recurrent kidney stones who presents for evaluation of right flank pain. For the past 2 days patient has had persistent right flank pain is waxing and waning sharp in intensity and nothing seems to make it better or worse. Pain radiates to her right groin. Pain is initially 10 out of 10 but improved to 7 out of 10 after taking ibuprofen today. Pain reminds her of either a urinary tract infections or kidney stones that she had in the past. She did notice some mild odor in her urine today. She denies having any fever, chills, vomiting, diarrhea, abdominal pain, burning urination, hematuria, vaginal bleeding or vaginal discharge. She has had no prior lithotripsy of urinary stenting for her prior kidney stones. She denies any trauma and no rash. She is currently taking Invokana for diabetes and states that she has frequent urination because of it.    Past Medical History  Diagnosis Date  . Kidney stones   . Diabetes mellitus   . Hyperlipidemia    Past Surgical History  Procedure Laterality Date  . Cesarean section    . Ablation    . Tubal ligation     Family History  Problem Relation Age of Onset  . Diabetes Mother   . Diabetes Father   . Heart disease Father    Social History  Substance Use Topics  . Smoking status: Former Smoker    Types: Cigarettes    Quit date: 05/26/2006  . Smokeless tobacco: None  . Alcohol Use: No   OB History    No data available     Review of Systems  All other systems reviewed and are negative.     Allergies  Review of patient's allergies indicates no known allergies.  Home Medications    Prior to Admission medications   Medication Sig Start Date End Date Taking? Authorizing Provider  aspirin EC 81 MG tablet Take 81 mg by mouth daily.    Historical Provider, MD  atenolol (TENORMIN) 50 MG tablet Take 50 mg by mouth daily.    Historical Provider, MD  atorvastatin (LIPITOR) 80 MG tablet Take 80 mg by mouth daily.    Historical Provider, MD  bromocriptine (PARLODEL) 2.5 MG tablet Take 0.5 tablets (1.25 mg total) by mouth daily. 04/18/15   Romero Belling, MD  Canagliflozin (INVOKANA) 300 MG TABS Take 1 tablet by mouth daily.     Historical Provider, MD  Cinnamon 500 MG TABS Take 1,000 mg by mouth daily.    Historical Provider, MD  fish oil-omega-3 fatty acids 1000 MG capsule Take 2 g by mouth daily.    Historical Provider, MD  glimepiride (AMARYL) 2 MG tablet Take 1 tablet (2 mg total) by mouth daily before breakfast. **PT NEEDS FOLLOW UP APPT** 05/01/15   Carlus Pavlov, MD  metFORMIN (GLUCOPHAGE) 1000 MG tablet Take 1 tablet (1,000 mg total) by mouth 2 (two) times daily with a meal. 11/15/14   Romero Belling, MD   BP 122/92 mmHg  Pulse 55  Temp(Src) 98.2 F (36.8 C) (Oral)  Resp 18  Ht  (1.626 m)  Wt 102.059 kg  BMI 38.60 kg/m2  SpO2 99% Physical Exam  Constitutional:  She appears well-developed and well-nourished. No distress.  HENT:  Head: Atraumatic.  Eyes: Conjunctivae are normal.  Neck: Neck supple.  Cardiovascular: Normal rate and regular rhythm.   Pulmonary/Chest: Effort normal and breath sounds normal.  Abdominal: Soft. Bowel sounds are normal. She exhibits no distension. There is no tenderness.  Genitourinary:  No CVA tenderness.  Neurological: She is alert.  Skin: No rash noted.  Psychiatric: She has a normal mood and affect.  Nursing note and vitals reviewed.   ED Course  Procedures (including critical care time) Labs Review Labs Reviewed  URINALYSIS, ROUTINE W REFLEX MICROSCOPIC (NOT AT [facility]) - Abnormal; Notable for the following:    Glucose, UA  >1000 (*)    All other components within normal limits  URINE MICROSCOPIC-ADD ON - Abnormal; Notable for the following:    Bacteria, UA RARE (*)    All other components within normal limits  PREGNANCY, URINE    Imaging Review Ct Renal Stone Study  08/07/2015  CLINICAL DATA:  Right flank pain for the past 3 days. History of nephrolithiasis and C-section. EXAM: CT ABDOMEN AND PELVIS WITHOUT CONTRAST TECHNIQUE: Multidetector CT imaging of the abdomen and pelvis was performed following the standard protocol without IV contrast. COMPARISON:  09/04/2011. FINDINGS: Lower chest:  Clear lung bases. Hepatobiliary: No mass visualized on this un-enhanced exam. Pancreas: No mass or inflammatory process identified on this un-enhanced exam. Spleen: Within normal limits in size. Adrenals/Urinary Tract: Normal appearing adrenal glands. Diffuse bilateral renal medullary calcification. Upper and lower pole right renal calculi. The largest is in the lower pole, measuring 6 mm. No bladder or ureteral calculi and no hydronephrosis. Stomach/Bowel: Unremarkable stomach, small bowel and colon. Normal appearing appendix. Vascular/Lymphatic: Atheromatous arterial calcifications. No enlarged lymph nodes. Reproductive: Unremarkable uterus and ovaries. Other: None. Musculoskeletal: Lower thoracic spine degenerative changes. Minimal lumbar spine degenerative changes. IMPRESSION: 1. Small, nonobstructing right renal calculi. 2. Bilateral medullary nephrocalcinosis. 3. No ureteral calculi or hydronephrosis. Electronically Signed   By: Beckie Salts M.D.   On: 08/07/2015 17:38   I have personally reviewed and evaluated these images and lab results as part of my medical decision-making.   EKG Interpretation None      MDM   Final diagnoses:  Right flank pain    BP 107/72 mmHg  Pulse 58  Temp(Src) 98 F (36.7 C) (Oral)  Resp 16  Ht  (1.626 m)  Wt 102.059 kg  BMI 38.60 kg/m2  SpO2 100%   5:05 PM Patient presents  with right flank pain radiates to her right groin. History of kidney stones in the past. No CVA tenderness on exam and no abdominal discomfort on exam. Her urine shows no evidence of urinary tract infection. Her pregnancy test is negative. Giving a prior history of kidney stones, a CT renal stone has been ordered. Toradol given for her pain.  6:54 PM Urine without any evidence of urinary tract infection. CT renal stone demonstrated a small nonobstructive right renal calculi and evidence of bilateral medullary nephrocalcinosis but no evidence of hydronephrosis or ureteral calculi. I discussed this with patient and she felt reassured. She is not sexually active for low suspicion for GU etiology. She will follow-up with the Dr. for further care. The pain much improved after taking Toradol. Return precaution discussed.  Fayrene Helper, PA-C 08/07/15 1855  Leta Baptist, MD 08/08/15 [phone]

## 2015-08-07 NOTE — Discharge Instructions (Signed)
Flank Pain °Flank pain refers to pain that is located on the side of the body between the upper abdomen and the back. The pain may occur over a short period of time (acute) or may be long-term or reoccurring (chronic). It may be mild or severe. Flank pain can be caused by many things. °CAUSES  °Some of the more common causes of flank pain include: °· Muscle strains.   °· Muscle spasms.   °· A disease of your spine (vertebral disk disease).   °· A lung infection (pneumonia).   °· Fluid around your lungs (pulmonary edema).   °· A kidney infection.   °· Kidney stones.   °· A very painful skin rash caused by the chickenpox virus (shingles).   °· Gallbladder disease.   °HOME CARE INSTRUCTIONS  °Home care will depend on the cause of your pain. In general, °· Rest as directed by your caregiver. °· Drink enough fluids to keep your urine clear or pale yellow. °· Only take over-the-counter or prescription medicines as directed by your caregiver. Some medicines may help relieve the pain. °· Tell your caregiver about any changes in your pain. °· Follow up with your caregiver as directed. °SEEK IMMEDIATE MEDICAL CARE IF:  °· Your pain is not controlled with medicine.   °· You have new or worsening symptoms. °· Your pain increases.   °· You have abdominal pain.   °· You have shortness of breath.   °· You have persistent nausea or vomiting.   °· You have swelling in your abdomen.   °· You feel faint or pass out.   °· You have blood in your urine. °· You have a fever or persistent symptoms for more than 2-3 days. °· You have a fever and your symptoms suddenly get worse. °MAKE SURE YOU:  °· Understand these instructions. °· Will watch your condition. °· Will get help right away if you are not doing well or get worse. °  °This information is not intended to replace advice given to you by your health care provider. Make sure you discuss any questions you have with your health care provider. °  °Document Released: 08/26/2005 Document  Revised: 03/29/2012 Document Reviewed: 02/17/2012 °Elsevier Interactive Patient Education ©2016 [employer] ° °

## 2015-08-07 NOTE — ED Notes (Signed)
Right flank pain x 2 days-states feels like kidney stone pain-NAD-steady gait

## 2015-12-02 ENCOUNTER — Encounter: Payer: Self-pay | Admitting: Endocrinology

## 2015-12-02 ENCOUNTER — Ambulatory Visit (INDEPENDENT_AMBULATORY_CARE_PROVIDER_SITE_OTHER): Payer: 59 | Admitting: Endocrinology

## 2015-12-02 VITALS — BP 118/78 | HR 62 | Temp 98.0°F | Ht 64.0 in | Wt 225.0 lb

## 2015-12-02 DIAGNOSIS — E119 Type 2 diabetes mellitus without complications: Secondary | ICD-10-CM | POA: Diagnosis not present

## 2015-12-02 LAB — POCT GLYCOSYLATED HEMOGLOBIN (HGB A1C): HEMOGLOBIN A1C: 10.7

## 2015-12-02 MED ORDER — METFORMIN HCL 1000 MG PO TABS: 1000.0000 mg | ORAL_TABLET | Freq: Two times a day (BID) | ORAL | Status: DC

## 2015-12-02 MED ORDER — GLIMEPIRIDE 2 MG PO TABS: 2.0000 mg | ORAL_TABLET | Freq: Every day | ORAL | Status: DC

## 2015-12-02 MED ORDER — BROMOCRIPTINE MESYLATE 2.5 MG PO TABS: 1.2500 mg | ORAL_TABLET | Freq: Every day | ORAL | Status: DC

## 2015-12-02 MED ORDER — CANAGLIFLOZIN 300 MG PO TABS: 300.0000 mg | ORAL_TABLET | Freq: Every day | ORAL | Status: DC

## 2015-12-02 NOTE — Progress Notes (Signed)
Subjective:    Patient ID: Laura [name], female    DOB: 1966-02-16, 50 y.o.   MRN: [id number]  HPI Pt returns for f/u of diabetes mellitus: DM type: 2 Dx'ed: 2004 Complications: polyneuropathy Therapy: 5 oral meds GDM: 2002 DKA: never Severe hypoglycemia: never Pancreatitis: never Other: she took insulin for a few months in 2014 Interval history: She has been missing meds, due to lack of insurance.  pt states she feels well in general.   Past Medical History  Diagnosis Date  . Kidney stones   . Diabetes mellitus   . Hyperlipidemia     Past Surgical History  Procedure Laterality Date  . Cesarean section    . Ablation    . Tubal ligation      Social History   Social History  . Marital Status: Single    Spouse Name: N/A  . Number of Children: N/A  . Years of Education: N/A   Occupational History  . Not on file.   Social History Main Topics  . Smoking status: Former Smoker    Types: Cigarettes    Quit date: 05/26/2006  . Smokeless tobacco: Not on file  . Alcohol Use: No  . Drug Use: No  . Sexual Activity: Yes    Birth Control/ Protection: Surgical   Other Topics Concern  . Not on file   Social History Narrative    Current Outpatient Prescriptions on File Prior to Visit  Medication Sig Dispense Refill  . aspirin EC 81 MG tablet Take 81 mg by mouth daily.    Marland Kitchen atenolol (TENORMIN) 50 MG tablet Take 50 mg by mouth daily.    Marland Kitchen atorvastatin (LIPITOR) 80 MG tablet Take 80 mg by mouth daily.    . Cinnamon 500 MG TABS Take 1,000 mg by mouth daily.    . fish oil-omega-3 fatty acids 1000 MG capsule Take 2 g by mouth daily.    . naproxen (NAPROSYN) 500 MG tablet Take 1 tablet (500 mg total) by mouth 2 (two) times daily as needed for moderate pain. 20 tablet 0   No current facility-administered medications on file prior to visit.    No Known Allergies  Family History  Problem Relation Age of Onset  . Diabetes Mother   . Diabetes Father   . Heart disease  Father     BP 118/78 mmHg  Pulse 62  Temp(Src) 98 F (36.7 C) (Oral)  Ht  (1.626 m)  Wt 225 lb (102.059 kg)  BMI 38.60 kg/m2  SpO2 98%   Review of Systems No weight change.      Objective:   Physical Exam VITAL SIGNS:  See vs page GENERAL: no distress Pulses: dorsalis pedis intact bilat.   MSK: no deformity of the feet CV: no leg edema Skin:  no ulcer on the feet.  normal color and temp on the feet. Neuro: sensation is intact to touch on the feet    A1c=10.7%    Assessment & Plan:  DM: worse due to ins problems.   Patient is advised the following: Patient Instructions  i have sent a prescription to your pharmacy, to refill your diabetes meds Please come to the lab in 2-3 week, for a diabetes blood test. check your blood sugar once a day.  vary the time of day when you check, between before the 3 meals, and at bedtime.  also check if you have symptoms of your blood sugar being too high or too low.  please keep a  record of the readings and bring it to your next appointment here (or you can bring the meter itself).  You can write it on any piece of paper.  please call us sooner if your blood sugar goes below 70, or if you have a lot of readings over 200. Please come back for a follow-up appointment in 4 months

## 2015-12-02 NOTE — Patient Instructions (Signed)
i have sent a prescription to your pharmacy, to refill your diabetes meds Please come to the lab in 2-3 week, for a diabetes blood test. check your blood sugar once a day.  vary the time of day when you check, between before the 3 meals, and at bedtime.  also check if you have symptoms of your blood sugar being too high or too low.  please keep a record of the readings and bring it to your next appointment here (or you can bring the meter itself).  You can write it on any piece of paper.  please call us sooner if your blood sugar goes below 70, or if you have a lot of readings over 200. Please come back for a follow-up appointment in 4 months

## 2016-02-19 ENCOUNTER — Other Ambulatory Visit: Payer: Self-pay | Admitting: Endocrinology

## 2016-02-24 ENCOUNTER — Other Ambulatory Visit: Payer: Self-pay | Admitting: Physician Assistant

## 2016-02-24 MED ORDER — ONDANSETRON HCL 4 MG PO TABS: 4.0000 mg | ORAL_TABLET | Freq: Every day | ORAL | 1 refills | Status: AC | PRN

## 2016-02-24 MED ORDER — HYOSCYAMINE SULFATE 0.125 MG SL SUBL: 0.1250 mg | SUBLINGUAL_TABLET | SUBLINGUAL | 1 refills | Status: DC | PRN

## 2016-02-24 NOTE — Progress Notes (Signed)
Patient calling with stomach virus x 2 day, requesting zofran. Will send in zofran and levsin, if not better or worsening AB pain come to the office or go to the ER.

## 2016-03-12 ENCOUNTER — Other Ambulatory Visit: Payer: Self-pay | Admitting: Family Medicine

## 2016-03-12 DIAGNOSIS — R109 Unspecified abdominal pain: Secondary | ICD-10-CM

## 2016-03-12 DIAGNOSIS — R1111 Vomiting without nausea: Secondary | ICD-10-CM

## 2016-03-24 ENCOUNTER — Other Ambulatory Visit: Payer: Self-pay | Admitting: Endocrinology

## 2016-04-02 ENCOUNTER — Ambulatory Visit: Payer: BC Managed Care – PPO | Admitting: Endocrinology

## 2016-04-16 ENCOUNTER — Ambulatory Visit (INDEPENDENT_AMBULATORY_CARE_PROVIDER_SITE_OTHER): Payer: Managed Care, Other (non HMO) | Admitting: Endocrinology

## 2016-04-16 ENCOUNTER — Encounter: Payer: Self-pay | Admitting: Endocrinology

## 2016-04-16 VITALS — BP 118/78 | HR 67 | Ht 64.0 in | Wt 223.0 lb

## 2016-04-16 DIAGNOSIS — E119 Type 2 diabetes mellitus without complications: Secondary | ICD-10-CM | POA: Diagnosis not present

## 2016-04-16 LAB — POCT GLYCOSYLATED HEMOGLOBIN (HGB A1C): Hemoglobin A1C: 8.5

## 2016-04-16 MED ORDER — SITAGLIPTIN PHOSPHATE 100 MG PO TABS: 100.0000 mg | ORAL_TABLET | Freq: Every day | ORAL | 3 refills | Status: DC

## 2016-04-16 NOTE — Patient Instructions (Addendum)
check your blood sugar once a day.  vary the time of day when you check, between before the 3 meals, and at bedtime.  also check if you have symptoms of your blood sugar being too high or too low.  please keep a record of the readings and bring it to your next appointment here (or you can bring the meter itself).  You can write it on any piece of paper.  please call us sooner if your blood sugar goes below 70, or if you have a lot of readings over 200.   I have sent a prescription to your pharmacy, to add [place].   Please come back for a follow-up appointment in 2 months.

## 2016-04-16 NOTE — Progress Notes (Signed)
Subjective:    Patient ID: Laura [name], female    DOB: December 31, 1965, 50 y.o.   MRN: [id number]  HPI Pt returns for f/u of diabetes mellitus: DM type: 2 Dx'ed: 2004 Complications: polyneuropathy.  Therapy: 5 oral meds GDM: 2002 DKA: never Severe hypoglycemia: never Pancreatitis: never Other: she took insulin for a few months in 2014.   Interval history: She has been once again missing meds, due to lack of insurance, but has been back on x 2 months.  pt states she feels well in general.    Past Medical History:  Diagnosis Date  . Diabetes mellitus   . Hyperlipidemia   . Kidney stones     Past Surgical History:  Procedure Laterality Date  . ABLATION    . CESAREAN SECTION    . TUBAL LIGATION      Social History   Social History  . Marital status: Single    Spouse name: N/A  . Number of children: N/A  . Years of education: N/A   Occupational History  . Not on file.   Social History Main Topics  . Smoking status: Former Smoker    Types: Cigarettes    Quit date: 05/26/2006  . Smokeless tobacco: Not on file  . Alcohol use No  . Drug use: No  . Sexual activity: Yes    Birth control/ protection: Surgical   Other Topics Concern  . Not on file   Social History Narrative  . No narrative on file    Current Outpatient Prescriptions on File Prior to Visit  Medication Sig Dispense Refill  . aspirin EC 81 MG tablet Take 81 mg by mouth daily.    Marland Kitchen atenolol (TENORMIN) 50 MG tablet Take 50 mg by mouth daily.    Marland Kitchen atorvastatin (LIPITOR) 80 MG tablet Take 80 mg by mouth daily.    . canagliflozin (INVOKANA) 300 MG TABS tablet Take 1 tablet (300 mg total) by mouth daily. 30 tablet 11  . Cinnamon 500 MG TABS Take 1,000 mg by mouth daily.    . CYCLOSET 0.8 MG TABS TAKE ONE TABLET BY MOUTH ONCE DAILY 30 tablet 0  . fish oil-omega-3 fatty acids 1000 MG capsule Take 2 g by mouth daily.    Marland Kitchen glimepiride (AMARYL) 2 MG tablet Take 1 tablet (2 mg total) by mouth daily before breakfast.  90 tablet 3  . hyoscyamine (LEVSIN/SL) 0.125 MG SL tablet Place 1 tablet (0.125 mg total) under the tongue every 4 (four) hours as needed. 30 tablet 1  . metFORMIN (GLUCOPHAGE) 1000 MG tablet Take 1 tablet (1,000 mg total) by mouth 2 (two) times daily with a meal. 180 tablet 3  . naproxen (NAPROSYN) 500 MG tablet Take 1 tablet (500 mg total) by mouth 2 (two) times daily as needed for moderate pain. 20 tablet 0  . ondansetron (ZOFRAN) 4 MG tablet Take 1 tablet (4 mg total) by mouth daily as needed for nausea or vomiting. 30 tablet 1   No current facility-administered medications on file prior to visit.     No Known Allergies  Family History  Problem Relation Age of Onset  . Diabetes Mother   . Diabetes Father   . Heart disease Father     BP 118/78   Pulse 67   Ht 5\' 4"  (1.626 m)   Wt 223 lb (101.2 kg)   SpO2 97%   BMI 38.28 kg/m    Review of Systems She denies hypoglycemia    Objective:  Physical Exam VITAL SIGNS:  See vs page GENERAL: no distress Pulses: dorsalis pedis intact bilat.   MSK: no deformity of the feet CV: no leg edema Skin:  no ulcer on the feet.  normal color and temp on the feet. Neuro: sensation is intact to touch on the feet, but decreased from normal.   Ext: left great toenail is absent.    A1c=8.5%    Assessment & Plan:  Type 2 DM: she needs increased rx

## 2016-05-07 ENCOUNTER — Other Ambulatory Visit: Payer: Self-pay | Admitting: Endocrinology

## 2016-06-05 ENCOUNTER — Other Ambulatory Visit: Payer: Self-pay | Admitting: Endocrinology

## 2016-06-17 ENCOUNTER — Ambulatory Visit: Payer: Managed Care, Other (non HMO) | Admitting: Endocrinology

## 2016-07-13 ENCOUNTER — Other Ambulatory Visit: Payer: Self-pay | Admitting: Endocrinology

## 2016-07-23 ENCOUNTER — Ambulatory Visit (INDEPENDENT_AMBULATORY_CARE_PROVIDER_SITE_OTHER): Payer: Managed Care, Other (non HMO) | Admitting: Endocrinology

## 2016-07-23 ENCOUNTER — Encounter: Payer: Self-pay | Admitting: Endocrinology

## 2016-07-23 VITALS — BP 118/70 | HR 66 | Ht 64.0 in | Wt 226.0 lb

## 2016-07-23 DIAGNOSIS — E119 Type 2 diabetes mellitus without complications: Secondary | ICD-10-CM

## 2016-07-23 LAB — POCT GLYCOSYLATED HEMOGLOBIN (HGB A1C): Hemoglobin A1C: 9.2

## 2016-07-23 MED ORDER — GLIMEPIRIDE 4 MG PO TABS: 4.0000 mg | ORAL_TABLET | Freq: Every day | ORAL | 3 refills | Status: DC

## 2016-07-23 NOTE — Progress Notes (Signed)
Subjective:    Patient ID: Laura [name], female    DOB: 04-28-1966, 51 y.o.   MRN: [id number]  HPI Pt returns for f/u of diabetes mellitus: DM type: 2 Dx'ed: 2004 Complications: polyneuropathy.  Therapy: 5 oral meds GDM: 2002 DKA: never Severe hypoglycemia: never Pancreatitis: never Other: she took insulin for a few months in 2014.   Interval history: She takes DM meds inconsistently.  pt states she feels well in general, except for recent URI, and intermittent yeast infections.   Past Medical History:  Diagnosis Date  . Diabetes mellitus   . Hyperlipidemia   . Kidney stones     Past Surgical History:  Procedure Laterality Date  . ABLATION    . CESAREAN SECTION    . TUBAL LIGATION      Social History   Social History  . Marital status: Single    Spouse name: N/A  . Number of children: N/A  . Years of education: N/A   Occupational History  . Not on file.   Social History Main Topics  . Smoking status: Former Smoker    Types: Cigarettes    Quit date: 05/26/2006  . Smokeless tobacco: Not on file  . Alcohol use No  . Drug use: No  . Sexual activity: Yes    Birth control/ protection: Surgical   Other Topics Concern  . Not on file   Social History Narrative  . No narrative on file    Current Outpatient Prescriptions on File Prior to Visit  Medication Sig Dispense Refill  . aspirin EC 81 MG tablet Take 81 mg by mouth daily.    Marland Kitchen atenolol (TENORMIN) 50 MG tablet Take 50 mg by mouth daily.    Marland Kitchen atorvastatin (LIPITOR) 80 MG tablet Take 80 mg by mouth daily.    . canagliflozin (INVOKANA) 300 MG TABS tablet Take 1 tablet (300 mg total) by mouth daily. 30 tablet 11  . Cinnamon 500 MG TABS Take 1,000 mg by mouth daily.    . CYCLOSET 0.8 MG TABS TAKE ONE TABLET BY MOUTH ONCE DAILY 30 tablet 0  . fish oil-omega-3 fatty acids 1000 MG capsule Take 2 g by mouth daily.    . hyoscyamine (LEVSIN/SL) 0.125 MG SL tablet Place 1 tablet (0.125 mg total) under the tongue every  4 (four) hours as needed. 30 tablet 1  . metFORMIN (GLUCOPHAGE) 1000 MG tablet Take 1 tablet (1,000 mg total) by mouth 2 (two) times daily with a meal. 180 tablet 3  . naproxen (NAPROSYN) 500 MG tablet Take 1 tablet (500 mg total) by mouth 2 (two) times daily as needed for moderate pain. 20 tablet 0  . ondansetron (ZOFRAN) 4 MG tablet Take 1 tablet (4 mg total) by mouth daily as needed for nausea or vomiting. 30 tablet 1  . sitaGLIPtin (JANUVIA) 100 MG tablet Take 1 tablet (100 mg total) by mouth daily. 90 tablet 3   No current facility-administered medications on file prior to visit.     No Known Allergies  Family History  Problem Relation Age of Onset  . Diabetes Mother   . Diabetes Father   . Heart disease Father     BP 118/70   Pulse 66   Ht 5\' 4"  (1.626 m)   Wt 226 lb (102.5 kg)   SpO2 98%   BMI 38.79 kg/m    Review of Systems She denies hypoglycemia.      Objective:   Physical Exam VITAL SIGNS:  See vs page  GENERAL: no distress Pulses: dorsalis pedis intact bilat.   MSK: no deformity of the feet CV: no leg edema Skin:  no ulcer on the feet.  normal color and temp on the feet. Neuro: sensation is intact to touch on the feet, but decreased from normal.      A1c=9.2%    Assessment & Plan:  Type 2 DM: worse. We discussed rx options.  She declines insulin, and the addition of more oral meds. Noncompliance with meds, persistent.  Patient is advised the following: Patient Instructions  check your blood sugar once a day.  vary the time of day when you check, between before the 3 meals, and at bedtime.  also check if you have symptoms of your blood sugar being too high or too low.  please keep a record of the readings and bring it to your next appointment here (or you can bring the meter itself).  You can write it on any piece of paper.  please call us sooner if your blood sugar goes below 70, or if you have a lot of readings over 200.   I have sent a prescription to  your pharmacy, to increase the glimepiride. Please continue the same other medications.   Please come back for a follow-up appointment in 2 months.

## 2016-07-23 NOTE — Patient Instructions (Addendum)
check your blood sugar once a day.  vary the time of day when you check, between before the 3 meals, and at bedtime.  also check if you have symptoms of your blood sugar being too high or too low.  please keep a record of the readings and bring it to your next appointment here (or you can bring the meter itself).  You can write it on any piece of paper.  please call us sooner if your blood sugar goes below 70, or if you have a lot of readings over 200.   I have sent a prescription to your pharmacy, to increase the glimepiride. Please continue the same other medications.   Please come back for a follow-up appointment in 2 months.

## 2016-08-29 ENCOUNTER — Other Ambulatory Visit: Payer: Self-pay | Admitting: Endocrinology

## 2016-09-27 ENCOUNTER — Encounter: Payer: Self-pay | Admitting: Endocrinology

## 2016-10-01 ENCOUNTER — Ambulatory Visit (INDEPENDENT_AMBULATORY_CARE_PROVIDER_SITE_OTHER): Payer: Managed Care, Other (non HMO) | Admitting: Endocrinology

## 2016-10-01 VITALS — BP 108/62 | HR 66 | Ht 64.0 in | Wt 226.6 lb

## 2016-10-01 DIAGNOSIS — E119 Type 2 diabetes mellitus without complications: Secondary | ICD-10-CM | POA: Diagnosis not present

## 2016-10-01 LAB — POCT GLYCOSYLATED HEMOGLOBIN (HGB A1C): Hemoglobin A1C: 9.9

## 2016-10-01 MED ORDER — GLUCOSE BLOOD VI STRP: 1.0000 | ORAL_STRIP | Freq: Two times a day (BID) | 3 refills | Status: DC

## 2016-10-01 MED ORDER — BASAGLAR KWIKPEN 100 UNIT/ML ~~LOC~~ SOPN: 30.0000 [IU] | PEN_INJECTOR | SUBCUTANEOUS | 11 refills | Status: DC

## 2016-10-01 NOTE — Patient Instructions (Addendum)
I have sent a prescription to your pharmacy, to start "basaglar," 30 units each morning. You can stop the diabetes pills. Here is a new meter.  I have sent a prescription to your pharmacy, for strips. Please call us next week, to tell us how the blood sugar is doing.   Please come back for a follow-up appointment in 2 months.  check your blood sugar twice a day.  vary the time of day when you check, between before the 3 meals, and at bedtime.  also check if you have symptoms of your blood sugar being too high or too low.  please keep a record of the readings and bring it to your next appointment here (or you can bring the meter itself).  You can write it on any piece of paper.  please call us sooner if your blood sugar goes below 70, or if you have a lot of readings over 200.

## 2016-10-01 NOTE — Progress Notes (Signed)
Subjective:    Patient ID: Laura [name], female    DOB: [birth year], 51 y.o.   MRN: [id number]  HPI Pt returns for f/u of diabetes mellitus: DM type: 2 Dx'ed: 2004 Complications: polyneuropathy.  Therapy: 5 oral meds GDM: 2002 DKA: never Severe hypoglycemia: never Pancreatitis: never Other: she took insulin for a few months in 2014.   Interval history: She has not recently taken the insulin.  She says cbg's are in the 200's.  pt states she feels well in general.   Past Medical History:  Diagnosis Date  . Diabetes mellitus   . Hyperlipidemia   . Kidney stones     Past Surgical History:  Procedure Laterality Date  . ABLATION    . CESAREAN SECTION    . TUBAL LIGATION      Social History   Social History  . Marital status: Single    Spouse name: N/A  . Number of children: N/A  . Years of education: N/A   Occupational History  . Not on file.   Social History Main Topics  . Smoking status: Former Smoker    Types: Cigarettes    Quit date: 05/26/2006  . Smokeless tobacco: Not on file  . Alcohol use No  . Drug use: No  . Sexual activity: Yes    Birth control/ protection: Surgical   Other Topics Concern  . Not on file   Social History Narrative  . No narrative on file    Current Outpatient Prescriptions on File Prior to Visit  Medication Sig Dispense Refill  . aspirin EC 81 MG tablet Take 81 mg by mouth daily.    Marland Kitchen. atenolol (TENORMIN) 50 MG tablet Take 50 mg by mouth daily.    Marland Kitchen. atorvastatin (LIPITOR) 80 MG tablet Take 80 mg by mouth daily.    . Cinnamon 500 MG TABS Take 1,000 mg by mouth daily.    . fish oil-omega-3 fatty acids 1000 MG capsule Take 2 g by mouth daily.    . hyoscyamine (LEVSIN/SL) 0.125 MG SL tablet Place 1 tablet (0.125 mg total) under the tongue every 4 (four) hours as needed. 30 tablet 1  . naproxen (NAPROSYN) 500 MG tablet Take 1 tablet (500 mg total) by mouth 2 (two) times daily as needed for moderate pain. 20 tablet 0  . ondansetron  (ZOFRAN) 4 MG tablet Take 1 tablet (4 mg total) by mouth daily as needed for nausea or vomiting. 30 tablet 1   No current facility-administered medications on file prior to visit.     No Known Allergies  Family History  Problem Relation Age of Onset  . Diabetes Mother   . Diabetes Father   . Heart disease Father     BP 108/62 (BP Location: Left Arm, Patient Position: Sitting, Cuff Size: Normal)   Pulse 66   Ht 5\' 4"  (1.626 m)   Wt 226 lb 9.6 oz (102.8 kg)   SpO2 97%   BMI 38.90 kg/m    Review of Systems She denies hypoglycemia.      Objective:   Physical Exam VITAL SIGNS:  See vs page.   GENERAL: no distress.  Pulses: dorsalis pedis intact bilat.   MSK: no deformity of the feet.  CV: no leg edema.   Skin:  no ulcer on the feet.  normal color and temp on the feet.  Neuro: sensation is intact to touch on the feet, but decreased from normal.    Lab Results  Component Value Date  HGBA1C 9.9 10/01/2016      Assessment & Plan:  Type 2 DM, with polyneuropathy: worse.  she declines MDI.    Patient is advised the following: Patient Instructions  I have sent a prescription to your pharmacy, to start "basaglar," 30 units each morning. You can stop the diabetes pills. Here is a new meter.  I have sent a prescription to your pharmacy, for strips. Please call us next week, to tell us how the blood sugar is doing.   Please come back for a follow-up appointment in 2 months.  check your blood sugar twice a day.  vary the time of day when you check, between before the 3 meals, and at bedtime.  also check if you have symptoms of your blood sugar being too high or too low.  please keep a record of the readings and bring it to your next appointment here (or you can bring the meter itself).  You can write it on any piece of paper.  please call us sooner if your blood sugar goes below 70, or if you have a lot of readings over 200.

## 2016-10-03 ENCOUNTER — Encounter: Payer: Self-pay | Admitting: Endocrinology

## 2016-10-04 ENCOUNTER — Other Ambulatory Visit: Payer: Self-pay | Admitting: Endocrinology

## 2016-10-04 MED ORDER — INSULIN NPH (HUMAN) (ISOPHANE) 100 UNIT/ML ~~LOC~~ SUSP: 30.0000 [IU] | SUBCUTANEOUS | 11 refills | Status: DC

## 2016-10-12 ENCOUNTER — Encounter: Payer: Self-pay | Admitting: Endocrinology

## 2016-10-18 ENCOUNTER — Telehealth: Payer: Self-pay | Admitting: Endocrinology

## 2016-10-18 NOTE — Telephone Encounter (Signed)
[facility] Call:  The caller stated that her BS was elevated 353 this AM.  She took 30 Units of insulin.  Repeat BS, 372 at 12:00 and all she has had is black coffee.  She states that she had a headache but it is getting better.  Now at 12:55 it is 415.  This nurse verified that the test strips were in date. She saw her doctor last at 10/01/16

## 2016-10-18 NOTE — Telephone Encounter (Signed)
See message and please advise during Dr. Ellison's absence, Thanks!  

## 2016-10-18 NOTE — Telephone Encounter (Signed)
Requested a call back to further discuss.  

## 2016-10-18 NOTE — Telephone Encounter (Signed)
Per prev. Messages, start R insulin 7 units 30 min before meals. Can take 5 units right now. Please call in R Relion vials to [facility]. Call back with sugars in 2 days.

## 2016-10-19 ENCOUNTER — Other Ambulatory Visit: Payer: Self-pay

## 2016-10-19 MED ORDER — "SYRINGE/NEEDLE (DISP) 25G X 5/8"" 1 ML MISC": 0 refills | Status: DC

## 2016-10-19 MED ORDER — INSULIN REGULAR HUMAN 100 UNIT/ML IJ SOLN: 7.0000 [IU] | Freq: Three times a day (TID) | INTRAMUSCULAR | 0 refills | Status: DC

## 2016-10-19 NOTE — Telephone Encounter (Signed)
Pt called back to speak with you, said that she got your message about the insulin, but she is out of town and said if you could please get in touch with her through MyChart, that would be easier. She also said that her sugar has been 365 in the morning average since the last message.

## 2016-10-19 NOTE — Telephone Encounter (Signed)
My Chart message submitted to the patient.

## 2016-10-20 ENCOUNTER — Telehealth: Payer: Self-pay | Admitting: Endocrinology

## 2016-10-20 MED ORDER — "INSULIN SYRINGE 30G X 1/2"" 1 ML MISC": 0 refills | Status: DC

## 2016-10-20 NOTE — Telephone Encounter (Signed)
Alternative insulin syringe size submitted to [facility] in [place].

## 2016-10-20 NOTE — Telephone Encounter (Signed)
Patient called the [facility], would like to know if she can change medication order, if she can prescribe a different size sryringe for the patient for Dr Everardo All    They can't dispense 1 ml bd 25g 5/8 syringe for insulin. There are other options, please advise

## 2016-10-25 ENCOUNTER — Encounter: Payer: Self-pay | Admitting: Endocrinology

## 2016-10-26 ENCOUNTER — Other Ambulatory Visit: Payer: Self-pay

## 2016-10-26 ENCOUNTER — Encounter: Payer: Self-pay | Admitting: Endocrinology

## 2016-10-26 ENCOUNTER — Other Ambulatory Visit: Payer: Self-pay | Admitting: Endocrinology

## 2016-10-26 DIAGNOSIS — E1140 Type 2 diabetes mellitus with diabetic neuropathy, unspecified: Secondary | ICD-10-CM

## 2016-10-26 MED ORDER — INSULIN NPH (HUMAN) (ISOPHANE) 100 UNIT/ML ~~LOC~~ SUSP: 50.0000 [IU] | SUBCUTANEOUS | 3 refills | Status: DC

## 2016-11-19 ENCOUNTER — Other Ambulatory Visit: Payer: Self-pay

## 2016-11-19 ENCOUNTER — Encounter: Payer: Self-pay | Admitting: Endocrinology

## 2016-11-19 MED ORDER — INSULIN REGULAR HUMAN 100 UNIT/ML IJ SOLN: 7.0000 [IU] | Freq: Three times a day (TID) | INTRAMUSCULAR | 0 refills | Status: DC

## 2016-11-26 ENCOUNTER — Encounter: Payer: Self-pay | Admitting: Endocrinology

## 2016-11-26 ENCOUNTER — Ambulatory Visit (INDEPENDENT_AMBULATORY_CARE_PROVIDER_SITE_OTHER): Payer: Managed Care, Other (non HMO) | Admitting: Endocrinology

## 2016-11-26 VITALS — BP 108/62 | HR 63 | Ht 64.0 in | Wt 235.8 lb

## 2016-11-26 DIAGNOSIS — E119 Type 2 diabetes mellitus without complications: Secondary | ICD-10-CM

## 2016-11-26 DIAGNOSIS — Z794 Long term (current) use of insulin: Secondary | ICD-10-CM

## 2016-11-26 MED ORDER — INSULIN NPH (HUMAN) (ISOPHANE) 100 UNIT/ML ~~LOC~~ SUSP: 30.0000 [IU] | Freq: Every day | SUBCUTANEOUS | 3 refills | Status: DC

## 2016-11-26 MED ORDER — INSULIN REGULAR HUMAN 100 UNIT/ML IJ SOLN: 17.0000 [IU] | Freq: Three times a day (TID) | INTRAMUSCULAR | 0 refills | Status: DC

## 2016-11-26 NOTE — Progress Notes (Signed)
Subjective:    Patient ID: Laura [name], female    DOB: June 29, 1966, 51 y.o.   MRN: [id number]  HPI Pt returns for f/u of diabetes mellitus: DM type: Insulin-requiring type 2 Dx'ed: 2004 Complications: polyneuropathy.  Therapy: insulin since 2018.   GDM: 2002 DKA: never Severe hypoglycemia: never Pancreatitis: never Other: she took insulin for a few months in 2014; she takes multiple daily injections.  She takes human insulin, due to cost.    Interval history:  pt states she feels well in general.  She takes NPH, 50 units qam, and reg, 7 units 3 times a day (just before each meal).  no cbg record, but states cbg's are persistently in the 200's.  There is no trend throughout the day.  Past Medical History:  Diagnosis Date  . Diabetes mellitus   . Hyperlipidemia   . Kidney stones     Past Surgical History:  Procedure Laterality Date  . ABLATION    . CESAREAN SECTION    . TUBAL LIGATION      Social History   Social History  . Marital status: Single    Spouse name: N/A  . Number of children: N/A  . Years of education: N/A   Occupational History  . Not on file.   Social History Main Topics  . Smoking status: Former Smoker    Types: Cigarettes    Quit date: 05/26/2006  . Smokeless tobacco: Never Used  . Alcohol use No  . Drug use: No  . Sexual activity: Yes    Birth control/ protection: Surgical   Other Topics Concern  . Not on file   Social History Narrative  . No narrative on file    Current Outpatient Prescriptions on File Prior to Visit  Medication Sig Dispense Refill  . aspirin EC 81 MG tablet Take 81 mg by mouth daily.    Marland Kitchen atenolol (TENORMIN) 50 MG tablet Take 50 mg by mouth daily.    Marland Kitchen atorvastatin (LIPITOR) 80 MG tablet Take 80 mg by mouth daily.    . Cinnamon 500 MG TABS Take 1,000 mg by mouth daily.    . fish oil-omega-3 fatty acids 1000 MG capsule Take 2 g by mouth daily.    Marland Kitchen glucose blood (ONETOUCH VERIO) test strip 1 each by Other route 2 (two)  times daily. And lancets 2/day 200 each 3  . hyoscyamine (LEVSIN/SL) 0.125 MG SL tablet Place 1 tablet (0.125 mg total) under the tongue every 4 (four) hours as needed. 30 tablet 1  . Insulin Syringe-Needle U-100 (INSULIN SYRINGE 1CC/30GX1/2") 30G X 1/2" 1 ML MISC Use to inject insulin 4 times per day. 100 each 0  . naproxen (NAPROSYN) 500 MG tablet Take 1 tablet (500 mg total) by mouth 2 (two) times daily as needed for moderate pain. 20 tablet 0  . ondansetron (ZOFRAN) 4 MG tablet Take 1 tablet (4 mg total) by mouth daily as needed for nausea or vomiting. 30 tablet 1   No current facility-administered medications on file prior to visit.     No Known Allergies  Family History  Problem Relation Age of Onset  . Diabetes Mother   . Diabetes Father   . Heart disease Father     BP 108/62 (BP Location: Right Arm, Patient Position: Sitting, Cuff Size: Normal)   Pulse 63   Ht 5\' 4"  (1.626 m)   Wt 235 lb 12.8 oz (107 kg)   SpO2 97%   BMI 40.47 kg/m   Review  of Systems She denies hypoglycemia.     Objective:   Physical Exam VITAL SIGNS:  See vs page.   GENERAL: no distress.  Pulses: dorsalis pedis intact bilat.   MSK: no deformity of the feet.  CV: no leg edema.   Skin:  no ulcer on the feet.  normal color and temp on the feet.  Neuro: sensation is intact to touch on the feet, but decreased from normal.   Ext: There is bilateral onychomycosis of the toenails.   Lab Results  Component Value Date   HGBA1C 9.9 10/01/2016      Assessment & Plan:  Insulin-requiring type 2 DM: she needs increased rx.    Patient Instructions  Please change the insulin to the numbers listed below.   Please call us next week, to tell us how the blood sugar is doing.   Please come back for a follow-up appointment in 2 months.  check your blood sugar twice a day.  vary the time of day when you check, between before the 3 meals, and at bedtime.  also check if you have symptoms of your blood sugar being  too high or too low.  please keep a record of the readings and bring it to your next appointment here (or you can bring the meter itself).  You can write it on any piece of paper.  please call us sooner if your blood sugar goes below 70, or if you have a lot of readings over 200.

## 2016-11-26 NOTE — Patient Instructions (Addendum)
Please change the insulin to the numbers listed below.   Please call us next week, to tell us how the blood sugar is doing.   Please come back for a follow-up appointment in 2 months.  check your blood sugar twice a day.  vary the time of day when you check, between before the 3 meals, and at bedtime.  also check if you have symptoms of your blood sugar being too high or too low.  please keep a record of the readings and bring it to your next appointment here (or you can bring the meter itself).  You can write it on any piece of paper.  please call us sooner if your blood sugar goes below 70, or if you have a lot of readings over 200.

## 2016-11-30 ENCOUNTER — Encounter: Payer: Self-pay | Admitting: Endocrinology

## 2016-12-01 ENCOUNTER — Other Ambulatory Visit: Payer: Self-pay

## 2016-12-01 MED ORDER — INSULIN REGULAR HUMAN 100 UNIT/ML IJ SOLN: 17.0000 [IU] | Freq: Three times a day (TID) | INTRAMUSCULAR | 0 refills | Status: DC

## 2016-12-01 MED ORDER — INSULIN NPH (HUMAN) (ISOPHANE) 100 UNIT/ML ~~LOC~~ SUSP: 30.0000 [IU] | Freq: Every day | SUBCUTANEOUS | 3 refills | Status: DC

## 2016-12-22 ENCOUNTER — Encounter: Payer: Self-pay | Admitting: Endocrinology

## 2016-12-23 IMAGING — CT CT RENAL STONE PROTOCOL
2 of 4 series · 16 of 46 positions shown, 18 images · non-contrast
Comparison: 09/04/2011.

CLINICAL DATA: Right flank pain for the past 3 days. History of
nephrolithiasis and C-section.

EXAM:
CT ABDOMEN AND PELVIS WITHOUT CONTRAST
TECHNIQUE: Multidetector CT imaging of the abdomen and pelvis was performed
following the standard protocol without IV contrast.

[Series 2: axial st · axial · 0.98mm/px · z∈[-180,+305]mm · 13 of 107 slices shown, 15 images]
[im 5/107  soft-tissue]
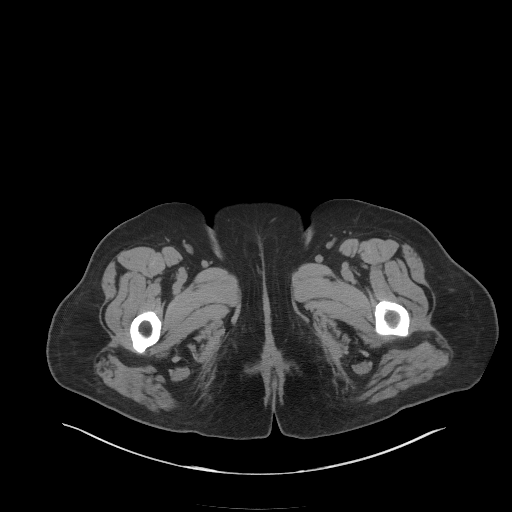
[im 5/107  bone]
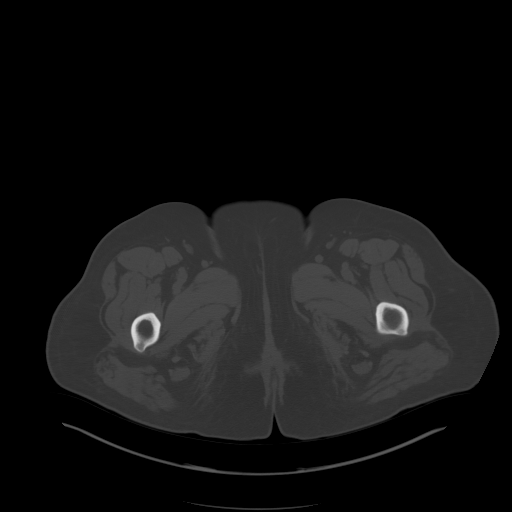
[im 14/107  soft-tissue]
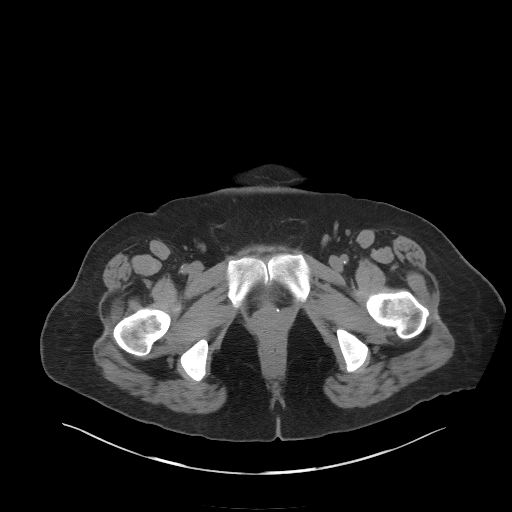
[im 24/107  soft-tissue]
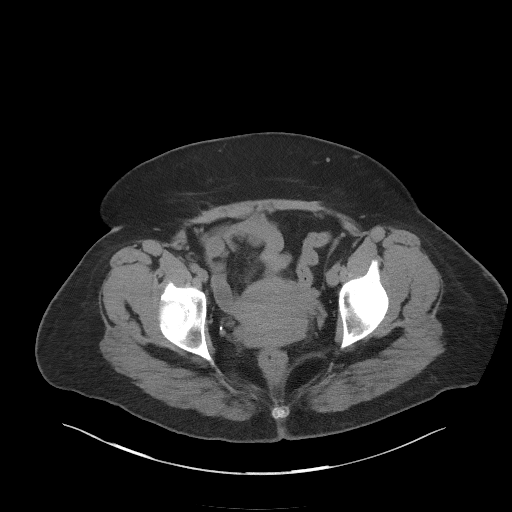
[im 28/107  soft-tissue]
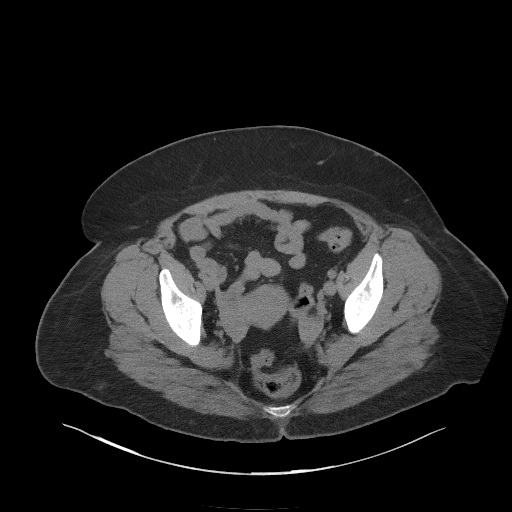
[im 37/107  soft-tissue]
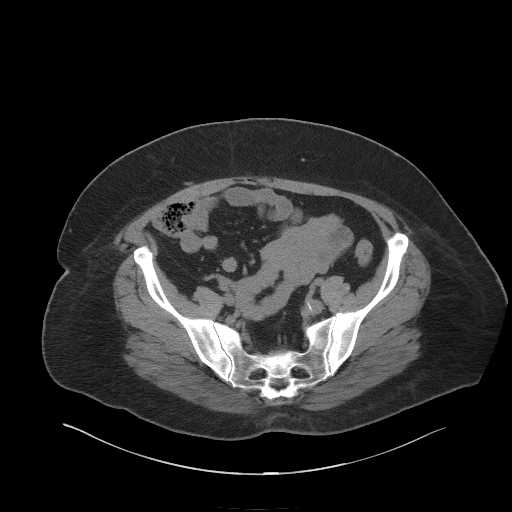
[im 47/107  soft-tissue]
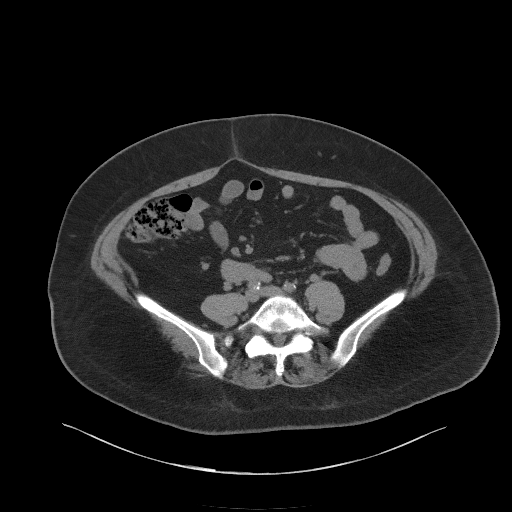
[im 56/107  soft-tissue]
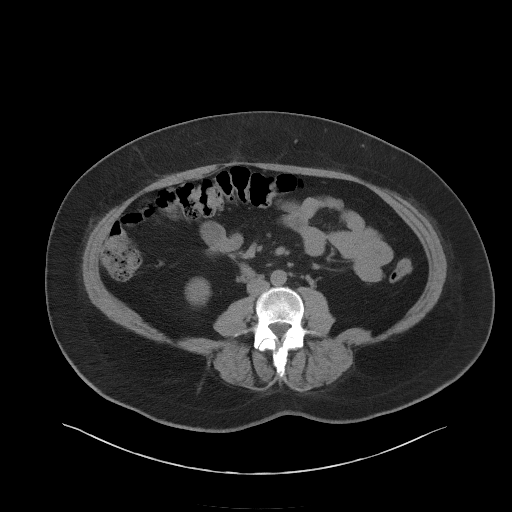
[im 60/107  soft-tissue]
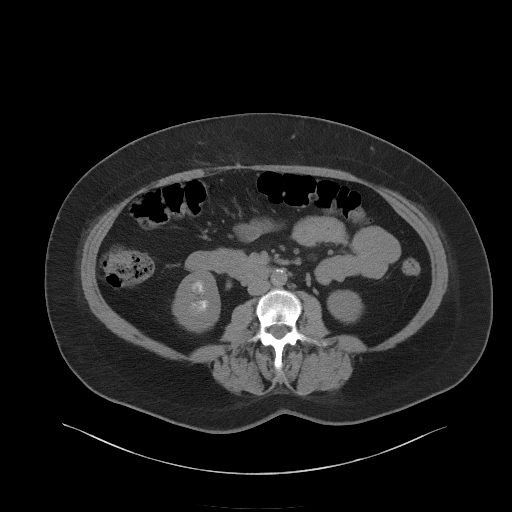
[im 70/107  soft-tissue]
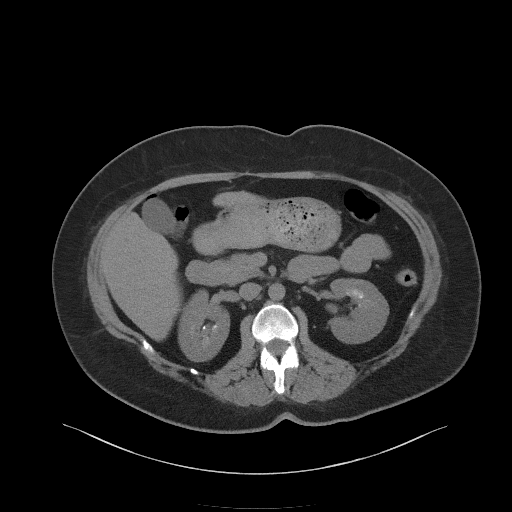
[im 70/107  bone]
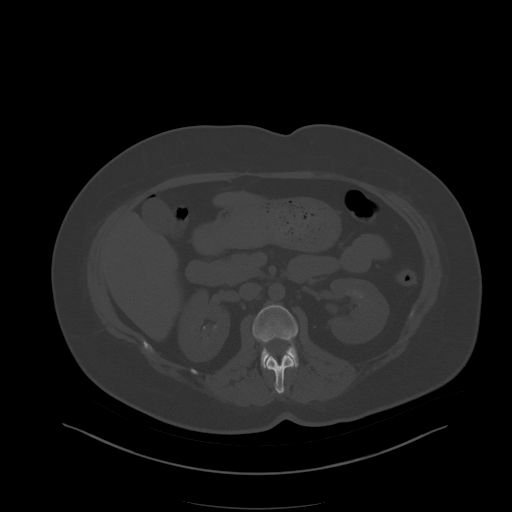
[im 79/107  soft-tissue]
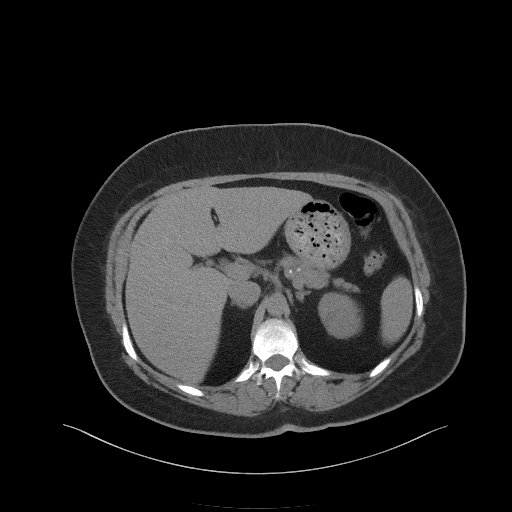
[im 83/107  soft-tissue]
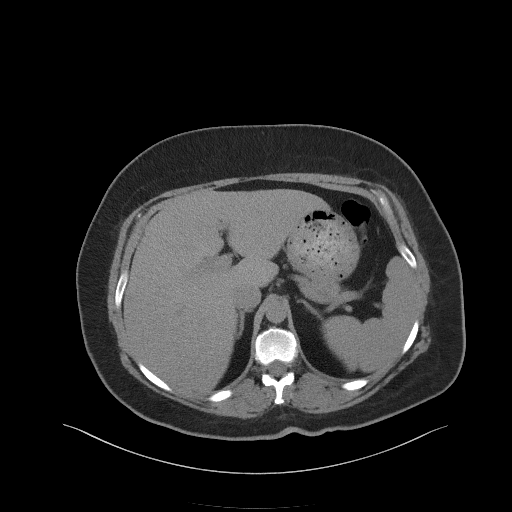
[im 93/107  soft-tissue]
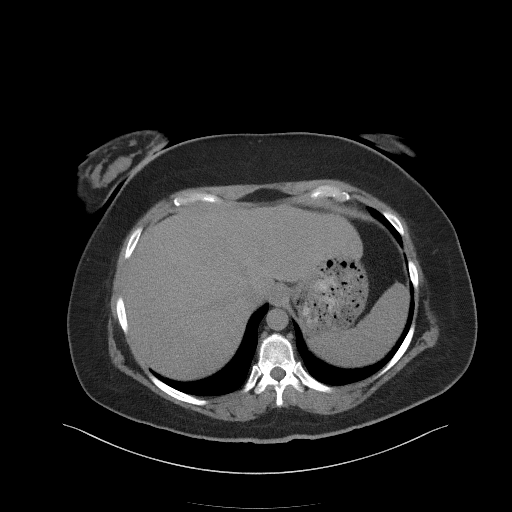
[im 102/107  soft-tissue]
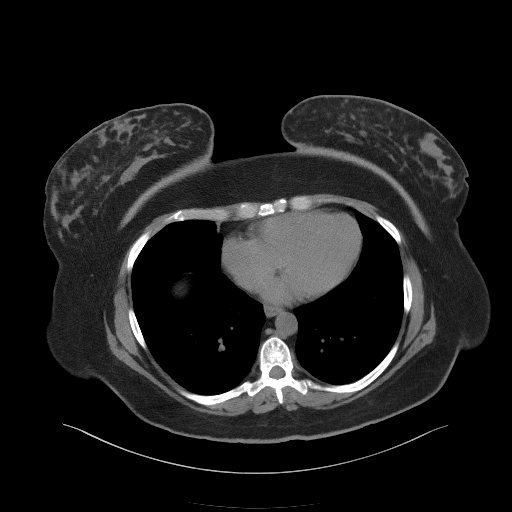

[Series 5: coronal st · coronal · 0.89mm/px · 3 of 113 slices shown]
[im 38/113  soft-tissue]
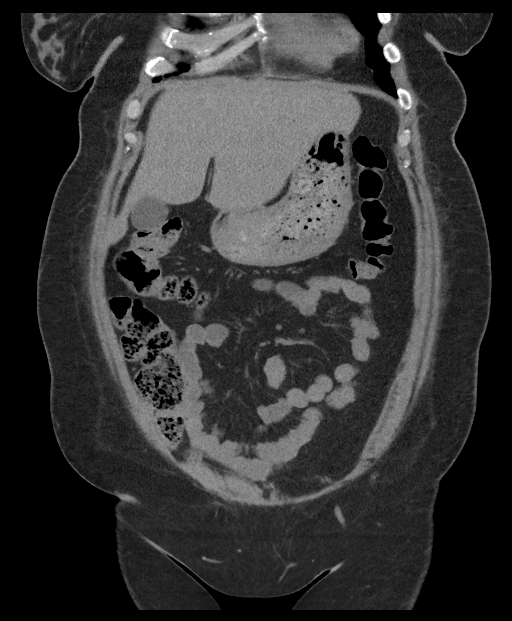
[im 50/113  soft-tissue]
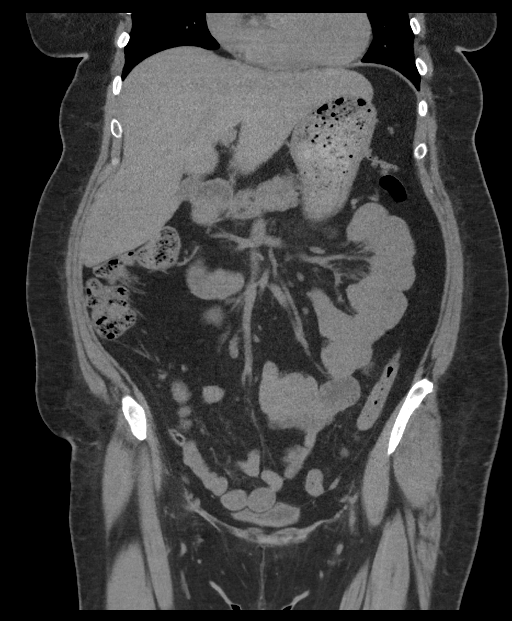
[im 63/113  soft-tissue]
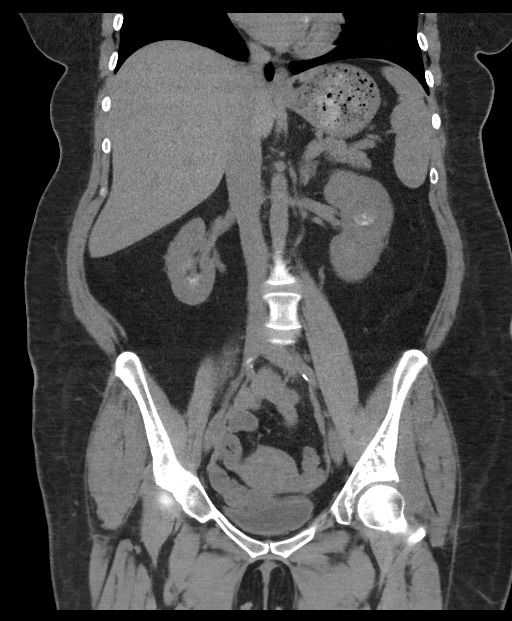

[16 of 46 positions shown; findings below may reference images not displayed]

FINDINGS: Lower chest:  Clear lung bases.

Hepatobiliary: No mass visualized on this un-enhanced exam.

Pancreas: No mass or inflammatory process identified on this
un-enhanced exam.

Spleen: Within normal limits in size.

Adrenals/Urinary Tract: Normal appearing adrenal glands. Diffuse
bilateral renal medullary calcification. Upper and lower pole right
renal calculi. The largest is in the lower pole, measuring 6 mm. No
bladder or ureteral calculi and no hydronephrosis.

Stomach/Bowel: Unremarkable stomach, small bowel and colon. Normal
appearing appendix.

Vascular/Lymphatic: Atheromatous arterial calcifications. No
enlarged lymph nodes.

Reproductive: Unremarkable uterus and ovaries.

Other: None.

Musculoskeletal: Lower thoracic spine degenerative changes. Minimal
lumbar spine degenerative changes.
IMPRESSION: 1. Small, nonobstructing right renal calculi.
2. Bilateral medullary nephrocalcinosis.
3. No ureteral calculi or hydronephrosis.

## 2016-12-26 ENCOUNTER — Encounter: Payer: Self-pay | Admitting: Endocrinology

## 2017-01-04 ENCOUNTER — Other Ambulatory Visit: Payer: Self-pay | Admitting: Endocrinology

## 2017-01-18 ENCOUNTER — Other Ambulatory Visit: Payer: Self-pay | Admitting: Physician Assistant

## 2017-01-21 ENCOUNTER — Ambulatory Visit: Payer: Managed Care, Other (non HMO) | Admitting: Endocrinology

## 2017-01-21 DIAGNOSIS — Z0289 Encounter for other administrative examinations: Secondary | ICD-10-CM

## 2017-01-25 ENCOUNTER — Other Ambulatory Visit: Payer: Self-pay | Admitting: Endocrinology

## 2017-01-25 ENCOUNTER — Encounter: Payer: Self-pay | Admitting: Endocrinology

## 2017-01-25 DIAGNOSIS — E119 Type 2 diabetes mellitus without complications: Secondary | ICD-10-CM

## 2017-01-25 DIAGNOSIS — Z794 Long term (current) use of insulin: Principal | ICD-10-CM

## 2022-10-29 ENCOUNTER — Inpatient Hospital Stay: Payer: Managed Care, Other (non HMO)

## 2022-10-29 ENCOUNTER — Encounter: Payer: Self-pay | Admitting: Internal Medicine

## 2022-10-29 ENCOUNTER — Inpatient Hospital Stay: Payer: Managed Care, Other (non HMO) | Attending: Internal Medicine | Admitting: Internal Medicine

## 2022-10-29 VITALS — BP 105/57 | HR 51 | Temp 96.4°F | Resp 16 | Ht 64.0 in | Wt 155.0 lb

## 2022-10-29 DIAGNOSIS — Z9884 Bariatric surgery status: Secondary | ICD-10-CM | POA: Diagnosis not present

## 2022-10-29 DIAGNOSIS — E611 Iron deficiency: Secondary | ICD-10-CM | POA: Diagnosis not present

## 2022-10-29 DIAGNOSIS — K909 Intestinal malabsorption, unspecified: Secondary | ICD-10-CM | POA: Diagnosis not present

## 2022-10-29 DIAGNOSIS — Z87891 Personal history of nicotine dependence: Secondary | ICD-10-CM | POA: Diagnosis not present

## 2022-10-29 DIAGNOSIS — D649 Anemia, unspecified: Secondary | ICD-10-CM

## 2022-10-29 LAB — CBC WITH DIFFERENTIAL/PLATELET
Abs Immature Granulocytes: 0.01 10*3/uL (ref 0.00–0.07)
Basophils Absolute: 0 10*3/uL (ref 0.0–0.1)
Basophils Relative: 1 %
Eosinophils Absolute: 0.2 10*3/uL (ref 0.0–0.5)
Eosinophils Relative: 4 %
HCT: 36.6 % (ref 36.0–46.0)
Hemoglobin: 11.8 g/dL — ABNORMAL LOW (ref 12.0–15.0)
Immature Granulocytes: 0 %
Lymphocytes Relative: 39 %
Lymphs Abs: 1.7 10*3/uL (ref 0.7–4.0)
MCH: 29.6 pg (ref 26.0–34.0)
MCHC: 32.2 g/dL (ref 30.0–36.0)
MCV: 91.7 fL (ref 80.0–100.0)
Monocytes Absolute: 0.3 10*3/uL (ref 0.1–1.0)
Monocytes Relative: 6 %
Neutro Abs: 2.3 10*3/uL (ref 1.7–7.7)
Neutrophils Relative %: 50 %
Platelets: 235 10*3/uL (ref 150–400)
RBC: 3.99 MIL/uL (ref 3.87–5.11)
RDW: 12.6 % (ref 11.5–15.5)
WBC: 4.5 10*3/uL (ref 4.0–10.5)
nRBC: 0 % (ref 0.0–0.2)

## 2022-10-29 LAB — COMPREHENSIVE METABOLIC PANEL
ALT: 27 U/L (ref 0–44)
AST: 23 U/L (ref 15–41)
Albumin: 4.1 g/dL (ref 3.5–5.0)
Alkaline Phosphatase: 70 U/L (ref 38–126)
Anion gap: 6 (ref 5–15)
BUN: 18 mg/dL (ref 6–20)
CO2: 26 mmol/L (ref 22–32)
Calcium: 8.8 mg/dL — ABNORMAL LOW (ref 8.9–10.3)
Chloride: 105 mmol/L (ref 98–111)
Creatinine, Ser: 0.65 mg/dL (ref 0.44–1.00)
GFR, Estimated: >60 mL/min (ref >60)
Glucose, Bld: 132 mg/dL — ABNORMAL HIGH (ref 70–99)
Potassium: 3.6 mmol/L (ref 3.5–5.1)
Sodium: 137 mmol/L (ref 135–145)
Total Bilirubin: 0.6 mg/dL (ref 0.3–1.2)
Total Protein: 6.6 g/dL (ref 6.5–8.1)

## 2022-10-29 LAB — IRON AND TIBC
Iron: 68 ug/dL (ref 28–170)
Saturation Ratios: 15 % (ref 10.4–31.8)
TIBC: 451 ug/dL — ABNORMAL HIGH (ref 250–450)
UIBC: 383 ug/dL

## 2022-10-29 LAB — RETICULOCYTES
Immature Retic Fract: 7.4 % (ref 2.3–15.9)
RBC.: 4 MIL/uL (ref 3.87–5.11)
Retic Count, Absolute: 45.6 10*3/uL (ref 19.0–186.0)
Retic Ct Pct: 1.1 % (ref 0.4–3.1)

## 2022-10-29 LAB — VITAMIN D 25 HYDROXY (VIT D DEFICIENCY, FRACTURES): Vit D, 25-Hydroxy: 40.75 ng/mL (ref 30–100)

## 2022-10-29 LAB — FERRITIN: Ferritin: 6 ng/mL — ABNORMAL LOW (ref 11–307)

## 2022-10-29 LAB — LACTATE DEHYDROGENASE: LDH: 134 U/L (ref 98–192)

## 2022-10-29 LAB — VITAMIN B12: Vitamin B-12: 5899 pg/mL — ABNORMAL HIGH (ref 180–914)

## 2022-10-29 NOTE — Progress Notes (Signed)
Had a gastric sleeve surgery 4 years ago. Thinks over time her ferritin has gotten low. Last ferritin level done in feb per patient was a 5. Does feel very fatigue and has more body aches than usual.

## 2022-10-29 NOTE — Progress Notes (Signed)
[facility] CONSULT [facility]  Patient Care Team: Darrow Bussing, MD as PCP - General (Family Medicine) Julio Sicks, NP as Nurse Practitioner (Obstetrics and Gynecology)  CHIEF COMPLAINTS/PURPOSE OF CONSULTATION: ANEMIA   HEMATOLOGY HISTORY  # ANEMIA[Hb; MCV-platelets- WBC; Iron sat; ferritin; 5 GFR- CT/US- ;  EGD- prior to gastric sleeve/colonoscopy [? 2020- [place]]  # Gastric by pass [[facility], 2020; Dr.Dasher]  HISTORY OF PRESENTING ILLNESS: Patient ambulating-independently.  Alone.   Laura [name] 57 y.o.  female pleasant patient history of gastric bypass in 2020 was been referred to [place] for further evaluation of low iron levels.  Patient was recently evaluated by her bariatric surgeon.  Noted to have low ferritin of 5.  Patient complains of ongoing fatigue.  Blood in stools: none Blood in urine: none Difficulty swallowing:none Change of bowel movement/constipation:none Prior blood transfusion:none Prior history of blood loss: none Liver disease:none Alcohol: none   Vaginal bleeding: none Prior evaluation with hematology: none Prior bone marrow biopsy: none Oral iron: none Prior IV iron infusions: none   Review of Systems  Constitutional:  Positive for malaise/fatigue. Negative for chills, diaphoresis, fever and weight loss.  HENT:  Negative for nosebleeds and sore throat.   Eyes:  Negative for double vision.  Respiratory:  Negative for cough, hemoptysis, sputum production, shortness of breath and wheezing.   Cardiovascular:  Negative for chest pain, palpitations, orthopnea and leg swelling.  Gastrointestinal:  Negative for abdominal pain, blood in stool, constipation, diarrhea, heartburn, melena, nausea and vomiting.  Genitourinary:  Negative for dysuria, frequency and urgency.  Musculoskeletal:  Negative for back pain and joint pain.  Skin: Negative.  Negative for itching and rash.  Neurological:  Negative for dizziness, tingling, focal weakness,  weakness and headaches.  Endo/Heme/Allergies:  Does not bruise/bleed easily.  Psychiatric/Behavioral:  Negative for depression. The patient is not nervous/anxious and does not have insomnia.      MEDICAL HISTORY:  Past Medical History:  Diagnosis Date   Anxiety    Arthritis    Depression    Diabetes mellitus    GERD (gastroesophageal reflux disease)    Heart murmur    Hyperlipidemia    Kidney stones     SURGICAL HISTORY: Past Surgical History:  Procedure Laterality Date   ABLATION     CESAREAN SECTION     COSMETIC SURGERY     TUBAL LIGATION      SOCIAL HISTORY: Social History   Socioeconomic History   Marital status: Single    Spouse name: Not on file   Number of children: Not on file   Years of education: Not on file   Highest education level: Not on file  Occupational History   Not on file  Tobacco Use   Smoking status: Former    Packs/day: 1.00    Years: 15.00    Additional pack years: 0.00    Total pack years: 15.00    Types: Cigarettes    Quit date: 05/26/2006    Years since quitting: 16.4   Smokeless tobacco: Never  Substance and Sexual Activity   Alcohol use: No   Drug use: No   Sexual activity: Not Currently    Birth control/protection: Surgical  Other Topics Concern   Not on file  Social History Narrative   Not on file   Social Determinants of Health   Financial Resource Strain: Not on file  Food Insecurity: Not on file  Transportation Needs: Not on file  Physical Activity: Not on file  Stress:  Not on file  Social Connections: Not on file  Intimate Partner Violence: Not on file    FAMILY HISTORY: Family History  Problem Relation Age of Onset   Diabetes Mother    Diabetes Father    Heart disease Father     ALLERGIES:  has No Known Allergies.  MEDICATIONS:  Current Outpatient Medications  Medication Sig Dispense Refill   ALPRAZolam (XANAX) 0.5 MG tablet Take 0.5 mg by mouth at bedtime as needed.     aspirin EC 81 MG tablet Take  81 mg by mouth daily.     atenolol (TENORMIN) 50 MG tablet Take 50 mg by mouth daily.     atorvastatin (LIPITOR) 80 MG tablet Take 80 mg by mouth daily.     Cetirizine HCl 10 MG CAPS Take 10 mg by mouth daily.     Cholecalciferol 125 MCG (5000 UT) capsule Take 5,000 Units by mouth daily.     Dulaglutide (TRULICITY) 1.5 MG/0.5ML SOPN Inject 1.5 mg into the skin daily.     fish oil-omega-3 fatty acids 1000 MG capsule Take 2 g by mouth daily.     gabapentin (NEURONTIN) 100 MG capsule Take 100 mg by mouth 3 (three) times daily.     lamoTRIgine (LAMICTAL) 200 MG tablet Take 200 mg by mouth daily.     lisdexamfetamine (VYVANSE) 60 MG capsule Take 60 mg by mouth every morning.     metFORMIN (GLUCOPHAGE) 1000 MG tablet Take 1,000 mg by mouth daily with breakfast.     omeprazole (PRILOSEC) 40 MG capsule Take 40 mg by mouth daily.     ondansetron (ZOFRAN-ODT) 4 MG disintegrating tablet Take 4 mg by mouth every 8 (eight) hours as needed.     QUEtiapine (SEROQUEL) 100 MG tablet Take 100 mg by mouth at bedtime.     SUMAtriptan (IMITREX) 100 MG tablet Take 100 mg by mouth every 2 (two) hours as needed.     valACYclovir (VALTREX) 500 MG tablet Take 500 mg by mouth daily.     No current facility-administered medications for this visit.     PHYSICAL EXAMINATION:   Vitals:   10/29/22 1345  BP: (!) 105/57  Pulse: (!) 51  Resp: 16  Temp: (!) 96.4 F (35.8 C)  SpO2: 100%   Filed Weights   10/29/22 1345  Weight: 155 lb (70.3 kg)    Physical Exam Vitals and nursing note reviewed.  HENT:     Head: Normocephalic and atraumatic.     Mouth/Throat:     Pharynx: Oropharynx is clear.  Eyes:     Extraocular Movements: Extraocular movements intact.     Pupils: Pupils are equal, round, and reactive to light.  Cardiovascular:     Rate and Rhythm: Normal rate and regular rhythm.  Pulmonary:     Comments: Decreased breath sounds bilaterally.  Abdominal:     Palpations: Abdomen is soft.   Musculoskeletal:        General: Normal range of motion.     Cervical back: Normal range of motion.  Skin:    General: Skin is warm.  Neurological:     General: No focal deficit present.     Mental Status: She is alert and oriented to person, place, and time.  Psychiatric:        Behavior: Behavior normal.        Judgment: Judgment normal.      LABORATORY DATA:  I have reviewed the data as listed Lab Results  Component Value Date  WBC 4.5 10/29/2022   HGB 11.8 (L) 10/29/2022   HCT 36.6 10/29/2022   MCV 91.7 10/29/2022   PLT 235 10/29/2022   Recent Labs    10/29/22 1419  NA 137  K 3.6  CL 105  CO2 26  GLUCOSE 132*  BUN 18  CREATININE 0.65  CALCIUM 8.8*  GFRNONAA >60  PROT 6.6  ALBUMIN 4.1  AST 23  ALT 27  ALKPHOS 70  BILITOT 0.6     No results found.  ASSESSMENT & PLAN:   Iron deficiency # Anemia- Hb-symptomatic.  Likely due to iron deficiency - from etiology GI malabsorption/gastric bypass.   Discussed regarding IV iron infusion/Venofer. Discussed the potential acute infusion reactions with IV iron; which are quite rare.  Patient understands the risk; will proceed with infusions.   # Recommend CBC CMP; iron studies ferritin B12.   #Etiology of iron deficiency: Most likely secondary to gastric bypass.  Again reminded patient to up-to-date with her screening studies.  Thank you Dr. Adolphus Birchwood for allowing me to participate in the care of your pleasant patient. Please do not hesitate to contact me with questions or concerns in the interim.  Mychart-  # DISPOSITION: # labs today # venofer weekly x 3 # follow up in 3 months-  MD: 1-2 days prior-labs- cbc;iron studies; ferritin; B12 possible venofer-Dr.B    All questions were answered. The patient knows to call the clinic with any problems, questions or concerns.    Earna Coder, MD 10/29/2022 3:00 PM

## 2022-10-29 NOTE — Assessment & Plan Note (Addendum)
#   Anemia- Hb-symptomatic.[Ferritin- 5; PCP]  Likely due to iron deficiency - from etiology GI malabsorption/gastric bypass.   Discussed regarding IV iron infusion/Venofer. Discussed the potential acute infusion reactions with IV iron; which are quite rare.  Patient understands the risk; will proceed with infusions.   # Recommend CBC CMP; iron studies ferritin B12.   #Etiology of iron deficiency: Most likely secondary to gastric bypass.  Again reminded patient to up-to-date with her screening studies.  Thank you Dr. Adolphus Birchwood for allowing me to participate in the care of your pleasant patient. Please do not hesitate to contact me with questions or concerns in the interim.  Mychart-  # DISPOSITION: # labs today # venofer weekly x 3 # follow up in 3 months-  MD: 1-2 days prior-labs- cbc;iron studies; ferritin; B12 possible venofer-Dr.B

## 2022-11-05 ENCOUNTER — Inpatient Hospital Stay: Payer: Managed Care, Other (non HMO)

## 2022-11-05 VITALS — BP 104/58 | HR 71 | Temp 97.3°F | Resp 16

## 2022-11-05 DIAGNOSIS — D649 Anemia, unspecified: Secondary | ICD-10-CM | POA: Diagnosis not present

## 2022-11-05 DIAGNOSIS — E611 Iron deficiency: Secondary | ICD-10-CM

## 2022-11-05 MED ORDER — SODIUM CHLORIDE 0.9 % IV SOLN
200.0000 mg | Freq: Once | INTRAVENOUS | Status: AC
  Administered 2022-11-05: 200 mg via INTRAVENOUS
  Filled 2022-11-05: qty 200

## 2022-11-05 MED ORDER — SODIUM CHLORIDE 0.9 % IV SOLN
Freq: Once | INTRAVENOUS | Status: AC
  Filled 2022-11-05: qty 250

## 2022-11-12 ENCOUNTER — Inpatient Hospital Stay: Payer: Managed Care, Other (non HMO)

## 2022-11-12 VITALS — BP 122/61 | HR 65 | Temp 97.0°F | Resp 18

## 2022-11-12 DIAGNOSIS — D649 Anemia, unspecified: Secondary | ICD-10-CM | POA: Diagnosis not present

## 2022-11-12 DIAGNOSIS — E611 Iron deficiency: Secondary | ICD-10-CM

## 2022-11-12 MED ORDER — SODIUM CHLORIDE 0.9 % IV SOLN
Freq: Once | INTRAVENOUS | Status: AC
  Filled 2022-11-12: qty 250

## 2022-11-12 MED ORDER — SODIUM CHLORIDE 0.9 % IV SOLN
200.0000 mg | Freq: Once | INTRAVENOUS | Status: AC
  Administered 2022-11-12: 200 mg via INTRAVENOUS
  Filled 2022-11-12: qty 200

## 2022-11-12 NOTE — Progress Notes (Signed)
Pt has been educated and understands. Pt declined to stay 30 mins after iron infusion. VSS.  

## 2022-11-19 ENCOUNTER — Inpatient Hospital Stay: Payer: Managed Care, Other (non HMO) | Attending: Internal Medicine

## 2022-11-19 VITALS — BP 97/44 | HR 56 | Temp 98.0°F | Resp 18

## 2022-11-19 DIAGNOSIS — E611 Iron deficiency: Secondary | ICD-10-CM

## 2022-11-19 DIAGNOSIS — D649 Anemia, unspecified: Secondary | ICD-10-CM | POA: Insufficient documentation

## 2022-11-19 MED ORDER — SODIUM CHLORIDE 0.9 % IV SOLN
Freq: Once | INTRAVENOUS | Status: AC
  Filled 2022-11-19: qty 250

## 2022-11-19 MED ORDER — SODIUM CHLORIDE 0.9 % IV SOLN
200.0000 mg | Freq: Once | INTRAVENOUS | Status: AC
  Administered 2022-11-19: 200 mg via INTRAVENOUS
  Filled 2022-11-19: qty 200

## 2023-01-26 ENCOUNTER — Inpatient Hospital Stay: Payer: Managed Care, Other (non HMO) | Attending: Internal Medicine

## 2023-01-26 DIAGNOSIS — D649 Anemia, unspecified: Secondary | ICD-10-CM | POA: Insufficient documentation

## 2023-01-26 DIAGNOSIS — E611 Iron deficiency: Secondary | ICD-10-CM

## 2023-01-26 LAB — IRON AND TIBC
Iron: 101 ug/dL (ref 28–170)
Saturation Ratios: 24 % (ref 10.4–31.8)
TIBC: 420 ug/dL (ref 250–450)
UIBC: 319 ug/dL

## 2023-01-26 LAB — CBC WITH DIFFERENTIAL (CANCER CENTER ONLY)
Abs Immature Granulocytes: 0.01 10*3/uL (ref 0.00–0.07)
Basophils Absolute: 0.1 10*3/uL (ref 0.0–0.1)
Basophils Relative: 1 %
Eosinophils Absolute: 0.4 10*3/uL (ref 0.0–0.5)
Eosinophils Relative: 8 %
HCT: 41.2 % (ref 36.0–46.0)
Hemoglobin: 13.5 g/dL (ref 12.0–15.0)
Immature Granulocytes: 0 %
Lymphocytes Relative: 38 %
Lymphs Abs: 1.8 10*3/uL (ref 0.7–4.0)
MCH: 30.2 pg (ref 26.0–34.0)
MCHC: 32.8 g/dL (ref 30.0–36.0)
MCV: 92.2 fL (ref 80.0–100.0)
Monocytes Absolute: 0.2 10*3/uL (ref 0.1–1.0)
Monocytes Relative: 5 %
Neutro Abs: 2.3 10*3/uL (ref 1.7–7.7)
Neutrophils Relative %: 48 %
Platelet Count: 237 10*3/uL (ref 150–400)
RBC: 4.47 MIL/uL (ref 3.87–5.11)
RDW: 12.6 % (ref 11.5–15.5)
WBC Count: 4.8 10*3/uL (ref 4.0–10.5)
nRBC: 0 % (ref 0.0–0.2)

## 2023-01-26 LAB — FERRITIN: Ferritin: 35 ng/mL (ref 11–307)

## 2023-01-26 LAB — VITAMIN B12: Vitamin B-12: 5670 pg/mL — ABNORMAL HIGH (ref 180–914)

## 2023-01-27 ENCOUNTER — Encounter: Payer: Self-pay | Admitting: Internal Medicine

## 2023-01-27 MED FILL — Iron Sucrose Inj 20 MG/ML (Fe Equiv): INTRAVENOUS | Qty: 10 | Status: AC

## 2023-01-28 ENCOUNTER — Inpatient Hospital Stay: Payer: Managed Care, Other (non HMO)

## 2023-01-28 ENCOUNTER — Inpatient Hospital Stay: Payer: Managed Care, Other (non HMO) | Admitting: Nurse Practitioner

## 2023-04-26 ENCOUNTER — Inpatient Hospital Stay: Payer: Managed Care, Other (non HMO)

## 2023-04-26 ENCOUNTER — Inpatient Hospital Stay: Payer: Managed Care, Other (non HMO) | Attending: Internal Medicine

## 2023-04-26 DIAGNOSIS — D649 Anemia, unspecified: Secondary | ICD-10-CM | POA: Diagnosis present

## 2023-04-26 DIAGNOSIS — E611 Iron deficiency: Secondary | ICD-10-CM

## 2023-04-26 LAB — VITAMIN B12: Vitamin B-12: 1959 pg/mL — ABNORMAL HIGH (ref 180–914)

## 2023-04-26 LAB — CBC WITH DIFFERENTIAL/PLATELET
Abs Immature Granulocytes: 0.01 10*3/uL (ref 0.00–0.07)
Basophils Absolute: 0 10*3/uL (ref 0.0–0.1)
Basophils Relative: 0 %
Eosinophils Absolute: 0.1 10*3/uL (ref 0.0–0.5)
Eosinophils Relative: 3 %
HCT: 39.1 % (ref 36.0–46.0)
Hemoglobin: 13 g/dL (ref 12.0–15.0)
Immature Granulocytes: 0 %
Lymphocytes Relative: 22 %
Lymphs Abs: 1.1 10*3/uL (ref 0.7–4.0)
MCH: 31 pg (ref 26.0–34.0)
MCHC: 33.2 g/dL (ref 30.0–36.0)
MCV: 93.3 fL (ref 80.0–100.0)
Monocytes Absolute: 0.3 10*3/uL (ref 0.1–1.0)
Monocytes Relative: 6 %
Neutro Abs: 3.4 10*3/uL (ref 1.7–7.7)
Neutrophils Relative %: 69 %
Platelets: 212 10*3/uL (ref 150–400)
RBC: 4.19 MIL/uL (ref 3.87–5.11)
RDW: 12.3 % (ref 11.5–15.5)
WBC: 4.9 10*3/uL (ref 4.0–10.5)
nRBC: 0 % (ref 0.0–0.2)

## 2023-04-26 LAB — IRON AND TIBC
Iron: 78 ug/dL (ref 28–170)
Saturation Ratios: 21 % (ref 10.4–31.8)
TIBC: 381 ug/dL (ref 250–450)
UIBC: 303 ug/dL

## 2023-04-26 LAB — FERRITIN: Ferritin: 30 ng/mL (ref 11–307)

## 2023-04-29 ENCOUNTER — Inpatient Hospital Stay: Payer: Managed Care, Other (non HMO)

## 2023-04-29 ENCOUNTER — Inpatient Hospital Stay: Payer: Managed Care, Other (non HMO) | Admitting: Internal Medicine

## 2023-07-11 ENCOUNTER — Other Ambulatory Visit: Payer: Self-pay | Admitting: *Deleted

## 2023-07-11 DIAGNOSIS — E611 Iron deficiency: Secondary | ICD-10-CM

## 2023-07-15 ENCOUNTER — Inpatient Hospital Stay: Payer: Managed Care, Other (non HMO)

## 2023-07-15 ENCOUNTER — Inpatient Hospital Stay: Payer: Managed Care, Other (non HMO) | Admitting: Nurse Practitioner

## 2024-02-28 ENCOUNTER — Other Ambulatory Visit: Payer: Self-pay | Admitting: Medical Genetics

## 2024-05-25 ENCOUNTER — Other Ambulatory Visit (HOSPITAL_COMMUNITY): Payer: Self-pay

## 2024-08-15 ENCOUNTER — Other Ambulatory Visit: Payer: Self-pay | Admitting: Medical Genetics

## 2024-08-15 DIAGNOSIS — Z006 Encounter for examination for normal comparison and control in clinical research program: Secondary | ICD-10-CM
